# Patient Record
Sex: Male | Born: 1965 | Race: White | Hispanic: No | Marital: Married | Smoking: Never smoker
Health system: Southern US, Community
[De-identification: ages and names within clinical notes are randomized; demographics above are authoritative.]

## PROBLEM LIST (undated history)

## (undated) DIAGNOSIS — E7800 Pure hypercholesterolemia, unspecified: Secondary | ICD-10-CM

## (undated) DIAGNOSIS — I10 Essential (primary) hypertension: Secondary | ICD-10-CM

## (undated) HISTORY — PX: CHOLECYSTECTOMY: SHX55

## (undated) HISTORY — PX: OTHER SURGICAL HISTORY: SHX169

---

## 2000-04-08 ENCOUNTER — Encounter: Payer: Self-pay | Admitting: Emergency Medicine

## 2000-04-08 ENCOUNTER — Inpatient Hospital Stay (HOSPITAL_COMMUNITY): Admission: EM | Admit: 2000-04-08 | Discharge: 2000-04-11 | Payer: Self-pay | Admitting: Emergency Medicine

## 2000-04-09 ENCOUNTER — Encounter: Payer: Self-pay | Admitting: General Surgery

## 2011-09-29 DIAGNOSIS — E785 Hyperlipidemia, unspecified: Secondary | ICD-10-CM | POA: Insufficient documentation

## 2011-09-29 DIAGNOSIS — I10 Essential (primary) hypertension: Secondary | ICD-10-CM | POA: Insufficient documentation

## 2011-10-15 ENCOUNTER — Other Ambulatory Visit: Payer: Self-pay

## 2011-10-15 ENCOUNTER — Encounter (HOSPITAL_COMMUNITY)
Admission: EM | Disposition: A | Discharge status: Home / Self Care | Payer: Self-pay | Source: Home / Self Care | Attending: Orthopedic Surgery

## 2011-10-15 ENCOUNTER — Emergency Department (HOSPITAL_COMMUNITY): Payer: 59

## 2011-10-15 ENCOUNTER — Emergency Department (HOSPITAL_COMMUNITY): Payer: 59 | Admitting: Anesthesiology

## 2011-10-15 ENCOUNTER — Encounter: Payer: Self-pay | Admitting: *Deleted

## 2011-10-15 ENCOUNTER — Inpatient Hospital Stay (HOSPITAL_COMMUNITY)
Admission: EM | Admit: 2011-10-15 | Discharge: 2011-10-17 | DRG: 494 | Disposition: A | Discharge status: Home Health | Payer: 59 | Attending: Orthopedic Surgery | Admitting: Orthopedic Surgery

## 2011-10-15 ENCOUNTER — Encounter (HOSPITAL_COMMUNITY): Payer: Self-pay | Admitting: Anesthesiology

## 2011-10-15 DIAGNOSIS — S82853A Displaced trimalleolar fracture of unspecified lower leg, initial encounter for closed fracture: Principal | ICD-10-CM | POA: Diagnosis present

## 2011-10-15 DIAGNOSIS — I498 Other specified cardiac arrhythmias: Secondary | ICD-10-CM | POA: Diagnosis present

## 2011-10-15 DIAGNOSIS — R001 Bradycardia, unspecified: Secondary | ICD-10-CM

## 2011-10-15 DIAGNOSIS — S62319A Displaced fracture of base of unspecified metacarpal bone, initial encounter for closed fracture: Secondary | ICD-10-CM | POA: Diagnosis present

## 2011-10-15 DIAGNOSIS — S62309A Unspecified fracture of unspecified metacarpal bone, initial encounter for closed fracture: Secondary | ICD-10-CM

## 2011-10-15 DIAGNOSIS — I10 Essential (primary) hypertension: Secondary | ICD-10-CM | POA: Diagnosis present

## 2011-10-15 DIAGNOSIS — E7800 Pure hypercholesterolemia, unspecified: Secondary | ICD-10-CM | POA: Diagnosis present

## 2011-10-15 HISTORY — PX: ORIF ANKLE FRACTURE: SHX5408

## 2011-10-15 HISTORY — DX: Pure hypercholesterolemia, unspecified: E78.00

## 2011-10-15 HISTORY — DX: Essential (primary) hypertension: I10

## 2011-10-15 LAB — BASIC METABOLIC PANEL
GFR calc non Af Amer: 86 mL/min — ABNORMAL LOW (ref >90)
Glucose, Bld: 94 mg/dL (ref 70–99)
Potassium: 4.1 mEq/L (ref 3.5–5.1)
Sodium: 140 mEq/L (ref 135–145)

## 2011-10-15 LAB — CBC
Hemoglobin: 15.8 g/dL (ref 13.0–17.0)
MCHC: 35.5 g/dL (ref 30.0–36.0)
RBC: 4.92 MIL/uL (ref 4.22–5.81)
WBC: 16.3 10*3/uL — ABNORMAL HIGH (ref 4.0–10.5)

## 2011-10-15 LAB — PROTIME-INR: INR: 1.01 (ref 0.00–1.49)

## 2011-10-15 SURGERY — OPEN REDUCTION INTERNAL FIXATION (ORIF) ANKLE FRACTURE
Anesthesia: General | Site: Ankle | Laterality: Right | Wound class: Clean

## 2011-10-15 MED ORDER — COUMADIN BOOK
Freq: Once | Status: AC
  Administered 2011-10-16: 06:00:00
  Filled 2011-10-15: qty 1

## 2011-10-15 MED ORDER — WARFARIN VIDEO
Freq: Once | Status: DC
  Filled 2011-10-15: qty 1

## 2011-10-15 MED ORDER — HYDROMORPHONE HCL PF 1 MG/ML IJ SOLN: 0.2500 mg | INTRAMUSCULAR | Status: DC | PRN

## 2011-10-15 MED ORDER — FENTANYL CITRATE 0.05 MG/ML IJ SOLN
INTRAMUSCULAR | Status: DC | PRN
  Administered 2011-10-15: 100 ug via INTRAVENOUS
  Administered 2011-10-15: 150 ug via INTRAVENOUS

## 2011-10-15 MED ORDER — CEFAZOLIN SODIUM 1-5 GM-% IV SOLN
1.0000 g | Freq: Four times a day (QID) | INTRAVENOUS | Status: AC
  Administered 2011-10-15 – 2011-10-16 (×3): 1 g via INTRAVENOUS
  Filled 2011-10-15 (×4): qty 50

## 2011-10-15 MED ORDER — MIDAZOLAM HCL 5 MG/5ML IJ SOLN
INTRAMUSCULAR | Status: DC | PRN
  Administered 2011-10-15: 2 mg via INTRAVENOUS

## 2011-10-15 MED ORDER — METOCLOPRAMIDE HCL 5 MG/ML IJ SOLN
5.0000 mg | Freq: Three times a day (TID) | INTRAMUSCULAR | Status: DC | PRN
  Filled 2011-10-15: qty 2

## 2011-10-15 MED ORDER — CRANBERRY 250 MG PO TABS: 250.0000 mg | ORAL_TABLET | Freq: Every day | ORAL | Status: DC

## 2011-10-15 MED ORDER — CEFAZOLIN SODIUM-DEXTROSE 2-3 GM-% IV SOLR
2.0000 g | INTRAVENOUS | Status: DC
  Filled 2011-10-15: qty 50

## 2011-10-15 MED ORDER — METHOCARBAMOL 500 MG PO TABS
500.0000 mg | ORAL_TABLET | Freq: Four times a day (QID) | ORAL | Status: DC | PRN
  Administered 2011-10-16 – 2011-10-17 (×5): 500 mg via ORAL
  Filled 2011-10-15 (×6): qty 1

## 2011-10-15 MED ORDER — TETANUS-DIPHTH-ACELL PERTUSSIS 5-2.5-18.5 LF-MCG/0.5 IM SUSP
0.5000 mL | Freq: Once | INTRAMUSCULAR | Status: AC
  Administered 2011-10-15: 0.5 mL via INTRAMUSCULAR
  Filled 2011-10-15: qty 0.5

## 2011-10-15 MED ORDER — LIDOCAINE HCL 2 % IJ SOLN
20.0000 mL | Freq: Once | INTRAMUSCULAR | Status: AC
  Administered 2011-10-15: 400 mg
  Filled 2011-10-15: qty 1

## 2011-10-15 MED ORDER — HYDROMORPHONE HCL PF 1 MG/ML IJ SOLN
1.0000 mg | Freq: Once | INTRAMUSCULAR | Status: AC
  Administered 2011-10-15: 1 mg via INTRAVENOUS
  Filled 2011-10-15: qty 1

## 2011-10-15 MED ORDER — THERA M PLUS PO TABS
1.0000 | ORAL_TABLET | Freq: Every day | ORAL | Status: DC
  Administered 2011-10-16 – 2011-10-17 (×2): 1 via ORAL
  Filled 2011-10-15 (×2): qty 1

## 2011-10-15 MED ORDER — ATENOLOL 100 MG PO TABS
100.0000 mg | ORAL_TABLET | Freq: Every day | ORAL | Status: DC
  Administered 2011-10-16 – 2011-10-17 (×2): 100 mg via ORAL
  Filled 2011-10-15 (×2): qty 1

## 2011-10-15 MED ORDER — ACETAMINOPHEN 10 MG/ML IV SOLN
INTRAVENOUS | Status: DC | PRN
  Administered 2011-10-15: 1000 mg via INTRAVENOUS

## 2011-10-15 MED ORDER — SODIUM CHLORIDE 0.9 % IV BOLUS (SEPSIS)
1000.0000 mL | Freq: Once | INTRAVENOUS | Status: AC
  Administered 2011-10-15: 1000 mL via INTRAVENOUS

## 2011-10-15 MED ORDER — ACETAMINOPHEN 10 MG/ML IV SOLN
1000.0000 mg | Freq: Four times a day (QID) | INTRAVENOUS | Status: AC
  Administered 2011-10-16 (×4): 1000 mg via INTRAVENOUS
  Filled 2011-10-15 (×4): qty 100

## 2011-10-15 MED ORDER — HYDROMORPHONE HCL 2 MG PO TABS
2.0000 mg | ORAL_TABLET | ORAL | Status: DC | PRN
  Administered 2011-10-15 – 2011-10-17 (×11): 2 mg via ORAL
  Filled 2011-10-15 (×11): qty 1

## 2011-10-15 MED ORDER — PROPOFOL 10 MG/ML IV EMUL
INTRAVENOUS | Status: DC | PRN
  Administered 2011-10-15: 380 mg via INTRAVENOUS
  Administered 2011-10-15: 100 mg via INTRAVENOUS

## 2011-10-15 MED ORDER — ASPIRIN EC 81 MG PO TBEC
81.0000 mg | DELAYED_RELEASE_TABLET | Freq: Every day | ORAL | Status: DC
  Administered 2011-10-15 – 2011-10-17 (×3): 81 mg via ORAL
  Filled 2011-10-15 (×3): qty 1

## 2011-10-15 MED ORDER — ONDANSETRON HCL 4 MG/2ML IJ SOLN: 4.0000 mg | Freq: Four times a day (QID) | INTRAMUSCULAR | Status: DC | PRN

## 2011-10-15 MED ORDER — WARFARIN SODIUM 10 MG PO TABS
10.0000 mg | ORAL_TABLET | ORAL | Status: AC
  Administered 2011-10-16: 10 mg via ORAL
  Filled 2011-10-15: qty 1

## 2011-10-15 MED ORDER — MEPERIDINE HCL 25 MG/ML IJ SOLN: 6.2500 mg | INTRAMUSCULAR | Status: DC | PRN

## 2011-10-15 MED ORDER — ONDANSETRON HCL 4 MG PO TABS: 4.0000 mg | ORAL_TABLET | Freq: Four times a day (QID) | ORAL | Status: DC | PRN

## 2011-10-15 MED ORDER — HYDROMORPHONE HCL PF 1 MG/ML IJ SOLN
1.0000 mg | Freq: Once | INTRAMUSCULAR | Status: AC
  Administered 2011-10-15: 1 mg via INTRAMUSCULAR
  Filled 2011-10-15: qty 1

## 2011-10-15 MED ORDER — POTASSIUM CHLORIDE IN NACL 20-0.9 MEQ/L-% IV SOLN
INTRAVENOUS | Status: AC
  Administered 2011-10-15: via INTRAVENOUS
  Administered 2011-10-17: 1000 mL via INTRAVENOUS
  Filled 2011-10-15 (×3): qty 1000

## 2011-10-15 MED ORDER — GEMFIBROZIL 600 MG PO TABS
600.0000 mg | ORAL_TABLET | Freq: Two times a day (BID) | ORAL | Status: DC
  Administered 2011-10-16 – 2011-10-17 (×3): 600 mg via ORAL
  Filled 2011-10-15 (×6): qty 1

## 2011-10-15 MED ORDER — CEFAZOLIN SODIUM 1-5 GM-% IV SOLN
INTRAVENOUS | Status: DC | PRN
  Administered 2011-10-15: 2 g via INTRAVENOUS

## 2011-10-15 MED ORDER — GLYCOPYRROLATE 0.2 MG/ML IJ SOLN
INTRAMUSCULAR | Status: DC | PRN
Start: 2011-10-15 — End: 2011-10-15
  Administered 2011-10-15: .5 mg via INTRAVENOUS

## 2011-10-15 MED ORDER — SODIUM CHLORIDE 0.9 % IR SOLN
Status: DC | PRN
  Administered 2011-10-15: 1000 mL

## 2011-10-15 MED ORDER — METOCLOPRAMIDE HCL 10 MG PO TABS: 5.0000 mg | ORAL_TABLET | Freq: Three times a day (TID) | ORAL | Status: DC | PRN

## 2011-10-15 MED ORDER — METHOCARBAMOL 100 MG/ML IJ SOLN
500.0000 mg | Freq: Four times a day (QID) | INTRAVENOUS | Status: DC | PRN
  Filled 2011-10-15: qty 5

## 2011-10-15 MED ORDER — SENNOSIDES-DOCUSATE SODIUM 8.6-50 MG PO TABS: 1.0000 | ORAL_TABLET | Freq: Every evening | ORAL | Status: DC | PRN

## 2011-10-15 MED ORDER — ONDANSETRON HCL 4 MG/2ML IJ SOLN: 4.0000 mg | Freq: Once | INTRAMUSCULAR | Status: DC | PRN

## 2011-10-15 MED ORDER — NEOSTIGMINE METHYLSULFATE 1 MG/ML IJ SOLN
INTRAMUSCULAR | Status: DC | PRN
  Administered 2011-10-15: 3 mg via INTRAVENOUS

## 2011-10-15 MED ORDER — ROCURONIUM BROMIDE 100 MG/10ML IV SOLN
INTRAVENOUS | Status: DC | PRN
  Administered 2011-10-15: 50 mg via INTRAVENOUS

## 2011-10-15 MED ORDER — HYDROCHLOROTHIAZIDE 25 MG PO TABS
25.0000 mg | ORAL_TABLET | Freq: Every day | ORAL | Status: DC
  Administered 2011-10-16 – 2011-10-17 (×2): 25 mg via ORAL
  Filled 2011-10-15 (×2): qty 1

## 2011-10-15 MED ORDER — FENTANYL CITRATE 0.05 MG/ML IJ SOLN: INTRAMUSCULAR | Status: DC | PRN

## 2011-10-15 MED ORDER — LACTATED RINGERS IV SOLN
INTRAVENOUS | Status: DC | PRN
  Administered 2011-10-15 (×2): via INTRAVENOUS

## 2011-10-15 MED ORDER — CEFAZOLIN SODIUM 1-5 GM-% IV SOLN
INTRAVENOUS | Status: AC
  Filled 2011-10-15: qty 100

## 2011-10-15 SURGICAL SUPPLY — 68 items
BANDAGE ELASTIC 3 VELCRO ST LF (GAUZE/BANDAGES/DRESSINGS) ×2 IMPLANT
BANDAGE ELASTIC 4 VELCRO ST LF (GAUZE/BANDAGES/DRESSINGS) ×2 IMPLANT
BANDAGE ELASTIC 6 VELCRO ST LF (GAUZE/BANDAGES/DRESSINGS) ×2 IMPLANT
BANDAGE GAUZE ELAST BULKY 4 IN (GAUZE/BANDAGES/DRESSINGS) ×2 IMPLANT
BLADE SURG 10 STRL SS (BLADE) IMPLANT
BNDG COHESIVE 6X5 TAN STRL LF (GAUZE/BANDAGES/DRESSINGS) ×2 IMPLANT
BNDG ESMARK 4X9 LF (GAUZE/BANDAGES/DRESSINGS) ×2 IMPLANT
CLOTH BEACON ORANGE TIMEOUT ST (SAFETY) ×2 IMPLANT
COVER MAYO STAND STRL (DRAPES) ×2 IMPLANT
COVER SURGICAL LIGHT HANDLE (MISCELLANEOUS) ×2 IMPLANT
CUFF TOURNIQUET SINGLE 34IN LL (TOURNIQUET CUFF) IMPLANT
CUFF TOURNIQUET SINGLE 44IN (TOURNIQUET CUFF) IMPLANT
DRAPE C-ARM 42X72 X-RAY (DRAPES) ×2 IMPLANT
DRAPE INCISE IOBAN 66X45 STRL (DRAPES) IMPLANT
DRAPE SURG 17X23 STRL (DRAPES) ×2 IMPLANT
DRAPE U-SHAPE 47X51 STRL (DRAPES) ×2 IMPLANT
DRSG PAD ABDOMINAL 8X10 ST (GAUZE/BANDAGES/DRESSINGS) ×2 IMPLANT
DURAPREP 26ML APPLICATOR (WOUND CARE) IMPLANT
ELECT REM PT RETURN 9FT ADLT (ELECTROSURGICAL) ×2
ELECTRODE REM PT RTRN 9FT ADLT (ELECTROSURGICAL) ×1 IMPLANT
FACESHIELD LNG OPTICON STERILE (SAFETY) ×2 IMPLANT
GAUZE XEROFORM 5X9 LF (GAUZE/BANDAGES/DRESSINGS) ×2 IMPLANT
GLOVE BIOGEL PI IND STRL 8 (GLOVE) ×1 IMPLANT
GLOVE BIOGEL PI INDICATOR 8 (GLOVE) ×1
GLOVE SURG ORTHO 8.0 STRL STRW (GLOVE) ×2 IMPLANT
GOWN PREVENTION PLUS LG XLONG (DISPOSABLE) IMPLANT
GOWN STRL NON-REIN LRG LVL3 (GOWN DISPOSABLE) ×6 IMPLANT
HANDPIECE INTERPULSE COAX TIP (DISPOSABLE)
KIT BASIN OR (CUSTOM PROCEDURE TRAY) ×2 IMPLANT
KIT ROOM TURNOVER OR (KITS) ×2 IMPLANT
MANIFOLD NEPTUNE II (INSTRUMENTS) ×2 IMPLANT
NEEDLE HYPO 25GX1X1/2 BEV (NEEDLE) ×2 IMPLANT
NS IRRIG 1000ML POUR BTL (IV SOLUTION) ×2 IMPLANT
PACK ORTHO EXTREMITY (CUSTOM PROCEDURE TRAY) ×2 IMPLANT
PAD ARMBOARD 7.5X6 YLW CONV (MISCELLANEOUS) ×2 IMPLANT
PAD CAST 4YDX4 CTTN HI CHSV (CAST SUPPLIES) ×2 IMPLANT
PADDING CAST COTTON 4X4 STRL (CAST SUPPLIES) ×2
PLATE FIBULA DIST 4H (Plate) ×2 IMPLANT
SCREW 5.0X18 (Screw) ×2 IMPLANT
SCREW LOCK 12X3.5XST PRLC (Screw) ×2 IMPLANT
SCREW LOCK 3.5X12 (Screw) ×2 IMPLANT
SCREW LOCK 3.5X14 (Screw) ×2 IMPLANT
SCREW LOCK 3.5X16 (Screw) ×4 IMPLANT
SCREW LOCK 3.5X26 (Screw) ×2 IMPLANT
SCREW NL 3.5X28 (Screw) ×2 IMPLANT
SCREW NL 3.5X38MM (Screw) ×2 IMPLANT
SCREW NL 3.5X50 (Screw) ×2 IMPLANT
SCREW NLCK 16X3.5XST CORT PRLC (Screw) ×2 IMPLANT
SCREW NON LOCK 3.5X20 (Screw) ×2 IMPLANT
SCREW NON LOCKING 3.5X48 (Screw) ×2 IMPLANT
SCREW NONLOCK 3.5X16 (Screw) ×2 IMPLANT
SCREW NONLOCK 3.5X18 (Screw) ×2 IMPLANT
SET HNDPC FAN SPRY TIP SCT (DISPOSABLE) IMPLANT
SPLINT FIBERGLASS 4X15 (CAST SUPPLIES) ×2 IMPLANT
SPONGE GAUZE 4X4 12PLY (GAUZE/BANDAGES/DRESSINGS) ×2 IMPLANT
SPONGE LAP 18X18 X RAY DECT (DISPOSABLE) ×2 IMPLANT
STOCKINETTE IMPERVIOUS 9X36 MD (GAUZE/BANDAGES/DRESSINGS) IMPLANT
SUCTION FRAZIER TIP 10 FR DISP (SUCTIONS) ×2 IMPLANT
SUT ETHILON 3 0 PS 1 (SUTURE) ×6 IMPLANT
SUT VIC AB 2-0 CTB1 (SUTURE) ×6 IMPLANT
SUT VIC AB 3-0 SH 27 (SUTURE) ×2
SUT VIC AB 3-0 SH 27X BRD (SUTURE) ×2 IMPLANT
SYR CONTROL 10ML LL (SYRINGE) ×2 IMPLANT
TOWEL OR 17X24 6PK STRL BLUE (TOWEL DISPOSABLE) ×2 IMPLANT
TOWEL OR 17X26 10 PK STRL BLUE (TOWEL DISPOSABLE) ×2 IMPLANT
TUBE CONNECTING 12X1/4 (SUCTIONS) ×2 IMPLANT
WATER STERILE IRR 1000ML POUR (IV SOLUTION) ×2 IMPLANT
YANKAUER SUCT BULB TIP NO VENT (SUCTIONS) IMPLANT
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
## 2011-10-15 NOTE — Op Note (Signed)
NAMEDARROL, BRANDENBURG NO.:  [id number]  MEDICAL RECORD NO.:  [id number]  LOCATION:  [facility]                         FACILITY:  [facility]  PHYSICIAN:  Burnard Bunting, M.D.    DATE OF BIRTH:  08/11/1966  DATE OF PROCEDURE:  10/15/2011 DATE OF DISCHARGE:                              OPERATIVE REPORT   PREOPERATIVE DIAGNOSIS:  Right trimalleolar ankle fracture.  POSTOPERATIVE DIAGNOSIS:  Right trimalleolar ankle fracture.  PROCEDURE:  Right trimalleolar ankle fracture, open reduction and internal fixation of lateral, posterior, and medial malleolus.  SURGEON:  Burnard Bunting, MD  ASSISTANT:  None.  ANESTHESIA:  General endotracheal.  ESTIMATED BLOOD LOSS:  50 mL.  DRAINS:  None.  Ankle Esmarch utilized for approximately 15 minutes.  INDICATIONS:  Tucker is a patient with right trimalleolar ankle fracture presents now for operative management after explanation of risks and benefits.  PROCEDURE IN DETAIL:  The patient was brought to the operating room where general endotracheal anesthesia was induced.  Preoperative antibiotics were administered.  Right leg was prescribed with alcohol and Betadine, which allowed to air dry and prepped with DuraPrep solution, draped in sterile manner.  Time-out was called.  Right leg was then prepped in DuraPrep solution and draped in a sterile manner.  Collier Flowers was used to cover the operative field.  Leg was elevated and exsanguinated with the ankle Esmarch, which was used around the distal ankle, so incision was made over the lateral malleolus.  Skin and subcutaneous tissues were sharply divided.  Care was taken to avoid injury to superficial peroneal nerve.  Fracture was reduced and held in place, and a lag screw was placed.  The plate was then placed for stabilization with good fixation and achieved in the bone.  Placement was confirmed in the AP and lateral planes under fluoroscopy.  This incision was thoroughly irrigated and  packed with a moist sponge. Incision was then directed towards the anteromedial tibia.  The skin and subcutaneous tissue were sharply divided.  Care was taken to avoid injury to the saphenous vein and nerve.  Medial malleolus fracture and the posterior malleolus fracture was reduced.  It was held with 2 screws, placed in necessary fashion to achieve fixation.  These were cortical screws placed in a lag fashion.  Good reduction was confirmed on the AP and lateral planes under fluoroscopy.  Syndesmosis was stable to stress.  Both incisions were then thoroughly irrigated.  Tourniquet was released.  Bleeding points were encountered and controlled using electrocautery.  Incisions were closed using a 3-0 Vicryl, 2-0 Vicryl, and 3-0 nylon.  The patient tolerated the procedure well without immediate complications.  Well-padded posterior splint was applied.     Burnard Bunting, M.D.     GSD/MEDQ  D:  10/15/2011  T:  10/15/2011  Job:  [id number]
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
## 2011-10-15 NOTE — ED Notes (Signed)
Ortho called to splint lt hand.

## 2011-10-15 NOTE — Anesthesia Postprocedure Evaluation (Signed)
  Anesthesia Post-op Note  Patient: Micheal Chandler  Procedure(s) Performed:  OPEN REDUCTION INTERNAL FIXATION (ORIF) ANKLE FRACTURE  Patient Location: PACU  Anesthesia Type: GA combined with regional for post-op pain  Level of Consciousness: awake, alert  and oriented  Airway and Oxygen Therapy: Patient Spontanous Breathing and Patient connected to nasal cannula oxygen  Post-op Pain: mild  Post-op Assessment: Post-op Vital signs reviewed, Patient's Cardiovascular Status Stable, Respiratory Function Stable, Patent Airway, No signs of Nausea or vomiting and Pain level controlled  Post-op Vital Signs: Reviewed and stable  Complications: No apparent anesthesia complications

## 2011-10-15 NOTE — Anesthesia Preprocedure Evaluation (Addendum)
Anesthesia Evaluation  Patient identified by MRN, date of birth, ID band Patient awake    Reviewed: Allergy & Precautions, H&P , NPO status , Patient's Chart, lab work & pertinent test results, reviewed documented beta blocker date and time   Airway Mallampati: I TM Distance: >3 FB Neck ROM: Full    Dental  (+) Teeth Intact and Dental Advisory Given   Pulmonary    Pulmonary exam normal       Cardiovascular hypertension, Pt. on home beta blockers     Neuro/Psych    GI/Hepatic   Endo/Other  Morbid obesity  Renal/GU      Musculoskeletal   Abdominal   Peds  Hematology   Anesthesia Other Findings   Reproductive/Obstetrics                          Anesthesia Physical Anesthesia Plan  ASA: II and Emergent  Anesthesia Plan: General and Regional   Post-op Pain Management:    Induction: Intravenous  Airway Management Planned: Oral ETT  Additional Equipment:   Intra-op Plan:   Post-operative Plan: Extubation in OR  Informed Consent: I have reviewed the patients History and Physical, chart, labs and discussed the procedure including the risks, benefits and alternatives for the proposed anesthesia with the patient or authorized representative who has indicated his/her understanding and acceptance.   Dental advisory given  Plan Discussed with: CRNA and Surgeon  Anesthesia Plan Comments:        Anesthesia Quick Evaluation

## 2011-10-15 NOTE — ED Notes (Signed)
Ortho at bed side to set lt wrist

## 2011-10-15 NOTE — Transfer of Care (Signed)
Immediate Anesthesia Transfer of Care Note  Patient: Micheal Chandler  Procedure(s) Performed:  OPEN REDUCTION INTERNAL FIXATION (ORIF) ANKLE FRACTURE  Patient Location: PACU  Anesthesia Type: General  Level of Consciousness: sedated  Airway & Oxygen Therapy: Patient Spontanous Breathing and Patient connected to nasal cannula oxygen  Post-op Assessment: Report given to PACU RN and Post -op Vital signs reviewed and stable  Post vital signs: Reviewed and stable  Complications: No apparent anesthesia complications

## 2011-10-15 NOTE — Progress Notes (Signed)
Orthopedic Tech Progress Note Patient Details:  Micheal Chandler 01/05/1966 [id number]  Type of Splint: Short Arm Splint Location: ulnar gutter splint Splint Interventions: Application    Nikki Dom 10/15/2011, 5:14 PM Dr Denton Lank gave verbal order for ulnar gutter splint

## 2011-10-15 NOTE — ED Notes (Signed)
Ancef sent to OR

## 2011-10-15 NOTE — ED Notes (Signed)
Dr. Denton Lank at bedside to numb area of fracture with lidocaine and assist with splinting of same.
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
## 2011-10-15 NOTE — ED Notes (Signed)
Patient is resting comfortably. Pain is decreased. Awaiting xray

## 2011-10-15 NOTE — Brief Op Note (Signed)
10/15/2011  9:04 PM  PATIENT:  Micheal Chandler  45 y.o. male  PRE-OPERATIVE DIAGNOSIS:  right ankle fracture  POST-OPERATIVE DIAGNOSIS:  Right Ankle Fracture  PROCEDURE:  Procedure(s): OPEN REDUCTION INTERNAL FIXATION (ORIF) ANKLE FRACTURE  SURGEON:  Surgeon(s): Corrie Mckusick Damondre Pfeifle  ASSISTANT:   ANESTHESIA:   regional and general  EBL: 50 ml    Total I/O In: 1100 [I.V.:1100] Out: -   BLOOD ADMINISTERED: none  DRAINS: none   LOCAL MEDICATIONS USED:  none  SPECIMEN:  No Specimen  COUNTS:  YES  TOURNIQUET:  * Missing tourniquet times found for documented tourniquets in log:  8782 * ankle esmarch 50 min  DICTATION: .Other Dictation: Dictation Number [id number]  PLAN OF CARE: Admit to inpatient   PATIENT DISPOSITION:  PACU - hemodynamically stable

## 2011-10-15 NOTE — ED Notes (Signed)
Lidocaine 2% pulled from pyxis and given to Dr. Denton Lank at this time.

## 2011-10-15 NOTE — ED Notes (Signed)
Ortho tech notified of need for right leg splint
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
## 2011-10-16 LAB — PROTIME-INR: INR: 1.18 (ref 0.00–1.49)

## 2011-10-16 MED ORDER — LACTATED RINGERS IV SOLN: INTRAVENOUS | Status: AC

## 2011-10-16 MED ORDER — WARFARIN SODIUM 10 MG PO TABS
10.0000 mg | ORAL_TABLET | Freq: Once | ORAL | Status: AC
  Administered 2011-10-16: 10 mg via ORAL
  Filled 2011-10-16: qty 1
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
## 2011-10-17 LAB — PROTIME-INR
INR: 1.36 (ref 0.00–1.49)
Prothrombin Time: 17 s — ABNORMAL HIGH (ref 11.6–15.2)

## 2011-10-17 MED ORDER — WARFARIN SODIUM 10 MG PO TABS
10.0000 mg | ORAL_TABLET | Freq: Once | ORAL | Status: DC
  Filled 2011-10-17: qty 1

## 2011-10-17 MED ORDER — WARFARIN SODIUM 10 MG PO TABS: 5.0000 mg | ORAL_TABLET | Freq: Once | ORAL | Status: DC

## 2011-10-17 MED ORDER — HYDROMORPHONE HCL 2 MG PO TABS: 2.0000 mg | ORAL_TABLET | ORAL | Status: AC | PRN

## 2011-10-17 MED ORDER — METHOCARBAMOL 500 MG PO TABS: 500.0000 mg | ORAL_TABLET | Freq: Four times a day (QID) | ORAL | Status: AC | PRN

## 2011-10-17 NOTE — Progress Notes (Signed)
Spoke with patient and wife. Choice offered. Home Health RN for coumadin management and PT arranged thru [facility]. Crutches ordered.
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
## 2011-10-17 NOTE — Progress Notes (Addendum)
Physical Therapy Treatment Patient Details Name: Micheal Chandler MRN: [id number] DOB: [birth year] Today's Date: 10/17/2011  PT Assessment/Plan  PT - Assessment/Plan Comments on Treatment Session: progressing well; Crutches seem to be the optimal assistive device as pt does not like the platform RW; pt very much wanting to dc home; will benefit form further crutch training, but otherwise on track for dc home PT Plan: Discharge plan remains appropriate PT Frequency: Min 6X/week Follow Up Recommendations: Home health PT Equipment Recommended: 3 in 1 bedside comode;Other (comment) (crutches, possibly wheelchair for work access) PT Goals  Acute Rehab PT Goals PT Goal: Supine/Side to Sit - Progress: Progressing toward goal PT Goal: Sit to Supine/Side - Progress: Progressing toward goal PT Transfer Goal: Sit to Stand/Stand to Sit - Progress: Progressing toward goal PT Transfer Goal: Bed to Chair/Chair to Bed - Progress: Other (comment) PT Goal: Ambulate - Progress: Progressing toward goal PT Goal: Up/Down Stairs - Progress: Progressing toward goal PT Goal: Propel Wheelchair - Progress: Other (comment)  PT Treatment Precautions/Restrictions  Precautions Precautions: Fall Precaution Comments: recommend staff member assist pt when using crutches Required Braces or Orthoses: No Restrictions Weight Bearing Restrictions: Yes LUE Weight Bearing: Weight bearing as tolerated (order states may WB through L hand) RLE Weight Bearing: Non weight bearing Mobility (including Balance) Bed Mobility Supine to Sit: 5: Supervision Supine to Sit Details (indicate cue type and reason): cues for optimal positioning Sitting - Scoot to Edge of Bed: 5: Supervision Transfers Sit to Stand: 4: Min assist;From bed (slight LOB upon initial stand) Sit to Stand Details (indicate cue type and reason): cues for self monitor for balance Stand to Sit: 4: Min assist Stand to Sit Details: cues for optimal positioning and  to control descent Ambulation/Gait Ambulation/Gait Assistance: 3: Mod assist Ambulation/Gait Assistance Details (indicate cue type and reason): up to mod A with crutches secondary to occasional LOB Ambulation Distance (Feet): 100 Feet Assistive device: Crutches;Other (Comment) (cued to WB through Lthumb or L axilla occasionally prn) Stairs: Yes Stairs Assistance: 3: Mod assist Stairs Assistance Details (indicate cue type and reason): verbal, tactile, demo cues for sequence, technique; practiced with 2 crutches and crutch and rail Stair Management Technique: With crutches Number of Stairs: 2  Wheelchair Mobility Wheelchair Mobility: No  Posture/Postural Control Posture/Postural Control: No significant limitations Balance Balance Assessed: No Exercise    End of Session PT - End of Session Equipment Utilized During Treatment: Gait belt Activity Tolerance: Patient tolerated treatment well Patient left: in chair;with call bell in reach Nurse Communication: Mobility status for ambulation;Mobility status for transfers General Behavior During Session: [facility] for tasks performed Cognition: [facility] for tasks performed  [place], [place] [phone]  10/17/2011, 11:03 AM
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
## 2011-10-21 ENCOUNTER — Other Ambulatory Visit: Payer: Self-pay | Admitting: Orthopedic Surgery

## 2011-10-21 ENCOUNTER — Ambulatory Visit
Admission: RE | Admit: 2011-10-21 | Discharge: 2011-10-21 | Disposition: A | Discharge status: Home / Self Care | Payer: 59 | Source: Ambulatory Visit | Attending: Orthopedic Surgery | Admitting: Orthopedic Surgery

## 2011-10-21 DIAGNOSIS — R52 Pain, unspecified: Secondary | ICD-10-CM

## 2011-10-21 DIAGNOSIS — R609 Edema, unspecified: Secondary | ICD-10-CM

## 2011-10-24 ENCOUNTER — Other Ambulatory Visit: Payer: Self-pay

## 2011-10-24 ENCOUNTER — Emergency Department (INDEPENDENT_AMBULATORY_CARE_PROVIDER_SITE_OTHER): Payer: 59

## 2011-10-24 ENCOUNTER — Emergency Department (HOSPITAL_BASED_OUTPATIENT_CLINIC_OR_DEPARTMENT_OTHER): Payer: 59

## 2011-10-24 ENCOUNTER — Encounter (HOSPITAL_BASED_OUTPATIENT_CLINIC_OR_DEPARTMENT_OTHER): Payer: Self-pay | Admitting: Emergency Medicine

## 2011-10-24 ENCOUNTER — Emergency Department (HOSPITAL_BASED_OUTPATIENT_CLINIC_OR_DEPARTMENT_OTHER)
Admission: EM | Admit: 2011-10-24 | Discharge: 2011-10-24 | Disposition: A | Discharge status: Home / Self Care | Payer: 59 | Attending: Emergency Medicine | Admitting: Emergency Medicine

## 2011-10-24 DIAGNOSIS — I10 Essential (primary) hypertension: Secondary | ICD-10-CM | POA: Insufficient documentation

## 2011-10-24 DIAGNOSIS — Z79899 Other long term (current) drug therapy: Secondary | ICD-10-CM | POA: Insufficient documentation

## 2011-10-24 DIAGNOSIS — R0989 Other specified symptoms and signs involving the circulatory and respiratory systems: Secondary | ICD-10-CM | POA: Insufficient documentation

## 2011-10-24 DIAGNOSIS — E0789 Other specified disorders of thyroid: Secondary | ICD-10-CM

## 2011-10-24 DIAGNOSIS — R0609 Other forms of dyspnea: Secondary | ICD-10-CM | POA: Insufficient documentation

## 2011-10-24 DIAGNOSIS — R0602 Shortness of breath: Secondary | ICD-10-CM | POA: Insufficient documentation

## 2011-10-24 DIAGNOSIS — R0600 Dyspnea, unspecified: Secondary | ICD-10-CM

## 2011-10-24 DIAGNOSIS — R791 Abnormal coagulation profile: Secondary | ICD-10-CM

## 2011-10-24 DIAGNOSIS — E7800 Pure hypercholesterolemia, unspecified: Secondary | ICD-10-CM | POA: Insufficient documentation

## 2011-10-24 LAB — BASIC METABOLIC PANEL
CO2: 28 mEq/L (ref 19–32)
Chloride: 102 mEq/L (ref 96–112)
Creatinine, Ser: 0.9 mg/dL (ref 0.50–1.35)
Glucose, Bld: 102 mg/dL — ABNORMAL HIGH (ref 70–99)

## 2011-10-24 LAB — DIFFERENTIAL
Basophils Absolute: 0.1 10*3/uL (ref 0.0–0.1)
Lymphocytes Relative: 27 % (ref 12–46)
Lymphs Abs: 2.7 10*3/uL (ref 0.7–4.0)
Monocytes Absolute: 1.1 10*3/uL — ABNORMAL HIGH (ref 0.1–1.0)
Neutro Abs: 6.1 10*3/uL (ref 1.7–7.7)

## 2011-10-24 LAB — CBC
HCT: 40.3 % (ref 39.0–52.0)
Hemoglobin: 14.2 g/dL (ref 13.0–17.0)
MCV: 86.7 fL (ref 78.0–100.0)
RBC: 4.65 MIL/uL (ref 4.22–5.81)
RDW: 12.6 % (ref 11.5–15.5)
WBC: 10.2 10*3/uL (ref 4.0–10.5)

## 2011-10-24 LAB — CARDIAC PANEL(CRET KIN+CKTOT+MB+TROPI)
CK, MB: 1.2 ng/mL (ref 0.3–4.0)
Total CK: 44 U/L (ref 7–232)
Troponin I: <0.3 ng/mL (ref <0.30)

## 2011-10-24 LAB — PROTIME-INR: Prothrombin Time: 17.4 seconds — ABNORMAL HIGH (ref 11.6–15.2)

## 2011-10-24 LAB — APTT: aPTT: 40 seconds — ABNORMAL HIGH (ref 24–37)

## 2011-10-24 MED ORDER — ENOXAPARIN SODIUM 100 MG/ML ~~LOC~~ SOLN: 1.0000 mg/kg | Freq: Two times a day (BID) | SUBCUTANEOUS | Status: DC

## 2011-10-24 MED ORDER — ENOXAPARIN SODIUM 150 MG/ML ~~LOC~~ SOLN
1.0000 mg/kg | Freq: Once | SUBCUTANEOUS | Status: AC
  Administered 2011-10-24: 135 mg via SUBCUTANEOUS
  Filled 2011-10-24: qty 1

## 2011-10-24 MED ORDER — IOHEXOL 350 MG/ML SOLN
80.0000 mL | Freq: Once | INTRAVENOUS | Status: AC | PRN
  Administered 2011-10-24: 80 mL via INTRAVENOUS

## 2011-10-24 MED ORDER — SODIUM CHLORIDE 0.9 % IV SOLN
INTRAVENOUS | Status: DC
  Administered 2011-10-24: 21:00:00 via INTRAVENOUS
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
## 2011-10-24 NOTE — Patient Instructions (Signed)
Pt instructed on the proper use of an incentive spirometry. Pt goals of . Pt accomplished goal of without any discomfort. Pt tolerated well.

## 2011-10-24 NOTE — ED Notes (Signed)
Patient is resting comfortably. 
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
## 2011-10-25 ENCOUNTER — Encounter (HOSPITAL_COMMUNITY): Payer: Self-pay | Admitting: Orthopedic Surgery

## 2011-10-28 NOTE — Progress Notes (Signed)
Utilization review completed. Anette Guarneri, RN, BSN. 10/28/11
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
## 2014-06-10 DIAGNOSIS — E669 Obesity, unspecified: Secondary | ICD-10-CM | POA: Insufficient documentation

## 2016-12-09 DIAGNOSIS — R3129 Other microscopic hematuria: Secondary | ICD-10-CM | POA: Insufficient documentation

## 2016-12-09 DIAGNOSIS — N419 Inflammatory disease of prostate, unspecified: Secondary | ICD-10-CM | POA: Insufficient documentation

## 2016-12-09 DIAGNOSIS — R300 Dysuria: Secondary | ICD-10-CM | POA: Insufficient documentation

## 2019-08-13 ENCOUNTER — Other Ambulatory Visit: Payer: Self-pay | Admitting: Podiatry

## 2019-08-13 ENCOUNTER — Encounter: Payer: Self-pay | Admitting: Podiatry

## 2019-08-13 ENCOUNTER — Other Ambulatory Visit: Payer: Self-pay

## 2019-08-13 ENCOUNTER — Ambulatory Visit: Payer: BC Managed Care – PPO | Admitting: Podiatry

## 2019-08-13 ENCOUNTER — Ambulatory Visit (INDEPENDENT_AMBULATORY_CARE_PROVIDER_SITE_OTHER): Payer: BC Managed Care – PPO

## 2019-08-13 VITALS — BP 138/79 | HR 55 | Resp 16

## 2019-08-13 DIAGNOSIS — M79672 Pain in left foot: Secondary | ICD-10-CM

## 2019-08-13 DIAGNOSIS — M109 Gout, unspecified: Secondary | ICD-10-CM | POA: Insufficient documentation

## 2019-08-13 DIAGNOSIS — M79671 Pain in right foot: Secondary | ICD-10-CM | POA: Diagnosis not present

## 2019-08-13 DIAGNOSIS — G629 Polyneuropathy, unspecified: Secondary | ICD-10-CM

## 2019-08-13 NOTE — Progress Notes (Signed)
   Subjective:    Patient ID: Micheal Chandler, male    DOB: Apr 21, 1966, 53 y.o.   MRN: [id number]  HPI    Review of Systems  All other systems reviewed and are negative.      Objective:   Physical Exam        Assessment & Plan:

## 2019-08-15 NOTE — Progress Notes (Signed)
Subjective:   Patient ID: Micheal Chandler, male   DOB: 53 y.o.   MRN: [id number]   HPI Patient presents that he is concerned about getting some numbness in his toes and that he is quite active and runs at least 20 miles a week.  Patient states is not debilitating he has had no issues with balance but just wanted to get it checked as he also is very heavy currently   Review of Systems  All other systems reviewed and are negative.       Objective:  Physical Exam Vitals signs and nursing note reviewed.  Constitutional:      Appearance: He is well-developed.  Pulmonary:     Effort: Pulmonary effort is normal.  Musculoskeletal: Normal range of motion.  Skin:    General: Skin is warm.  Neurological:     Mental Status: He is alert.     Neurovascular status found to be intact muscle strength adequate range of motion within normal limits with patient noted to have mild numbness in the digits bilateral.  I did full sharp dull vibratory and DTR reflexes and did not note any pathology and questioned him extensively on any other forms of neurological pathology     Assessment:  Possibility for low-grade neuropathy with obesity is complicating factor and history of gout     Plan:  H&P reviewed x-rays and at this point recommended supportive shoes and anti-inflammatories and will see patient back if symptoms were to persist.  I did discuss neurological consult if he should start to develop any balance issues or progression of the low-grade numbness that he is experiencing in his feet  X-rays were negative for signs of arthritis or any other kinds of bone pathology at the current time

## 2019-12-03 ENCOUNTER — Other Ambulatory Visit: Payer: Self-pay

## 2019-12-03 ENCOUNTER — Ambulatory Visit: Payer: BC Managed Care – PPO | Admitting: Neurology

## 2019-12-03 ENCOUNTER — Encounter: Payer: Self-pay | Admitting: Neurology

## 2019-12-03 VITALS — BP 135/78 | HR 51 | Temp 97.8°F | Ht 70.0 in | Wt 288.0 lb

## 2019-12-03 DIAGNOSIS — R2689 Other abnormalities of gait and mobility: Secondary | ICD-10-CM | POA: Diagnosis not present

## 2019-12-03 DIAGNOSIS — R200 Anesthesia of skin: Secondary | ICD-10-CM

## 2019-12-03 NOTE — Progress Notes (Signed)
[facility]  PATIENT: Micheal Chandler DOB: 1966/02/12  REFERRING DOCTOR OR PCP:  Donald Prose. MD SOURCE: patient, notes and labs from Dr. Nancy Fetter  _________________________________   HISTORICAL  CHIEF COMPLAINT:  Chief Complaint  Patient presents with  . New Patient (Initial Visit)    RM 12, alone. Paper referral for numbness in toes, balance issues.     HISTORY OF PRESENT ILLNESS:  I had the pleasure of seeing your patient, Micheal Chandler, at [facility] for neurologic consultation regarding his foot numbness and difficulties with balance.  He is a 53 year old man who has noted numbness in his feet and difficulties in balance since August while training for a 50K run.   The runs are on trails.   Initially, he had numbness up the toes but now is just in the toe tips now.    He has high quality running shoes.   He is still walking 5K a day but is not running.    He has not noted any weakness.    He denies any bladder problems.  He also has notes that balance is slightly off.  He has had similar issues off/on in the past with sinus issues.    He denies vertigo.   He can go up/down stairs without using the bannister.  However, if he walks a balance beam or straight line he feels he is off.      He is generally in good shape and has done marathons and long trail runs x many years.   However, he does have hypertension and hyperlipidemia.  He does not have hyperglycemia/DM.   He has no neck pain or stiffness.      No changes in bladder function over the past year.      I reviewed labs from 07/17/2019.   Glucose was 88.  Other labs also normal.  REVIEW OF SYSTEMS: Constitutional: No fevers, chills, sweats, or change in appetite Eyes: No visual changes, double vision, eye pain Ear, nose and throat: No hearing loss, ear pain, nasal congestion, sore throat Cardiovascular: No chest pain, palpitations Respiratory: No shortness of breath at rest or with  exertion.   No wheezes GastrointestinaI: No nausea, vomiting, diarrhea, abdominal pain, fecal incontinence Genitourinary: No dysuria, urinary retention or frequency.  No nocturia. Musculoskeletal: No neck pain, back pain Integumentary: No rash, pruritus, skin lesions Neurological: as above Psychiatric: No depression at this time.  No anxiety Endocrine: No palpitations, diaphoresis, change in appetite, change in weigh or increased thirst Hematologic/Lymphatic: No anemia, purpura, petechiae. Allergic/Immunologic: No itchy/runny eyes, nasal congestion, recent allergic reactions, rashes  ALLERGIES: Allergies  Allergen Reactions  . Warfarin Sodium Other (See Comments)    Patient states he had difficulty breathing; pt stated, "It does not make me feel good"  . Hydrocodone     Patient states that it does not work for him    HOME MEDICATIONS:  Current Outpatient Medications:  .  aspirin EC 81 MG tablet, Take 81 mg by mouth daily.  , Disp: , Rfl:  .  atenolol (TENORMIN) 100 MG tablet, Take 100 mg by mouth daily.  , Disp: , Rfl:  .  Cholecalciferol (VITAMIN D3 PO), Take 5,000 Units by mouth daily., Disp: , Rfl:  .  CRANBERRY PO, Take 1 capsule by mouth daily.  , Disp: , Rfl:  .  gemfibrozil (LOPID) 600 MG tablet, Take 600 mg by mouth daily. , Disp: , Rfl:  .  hydrochlorothiazide (HYDRODIURIL) 25 MG  tablet, Take 25 mg by mouth daily.  , Disp: , Rfl:  .  Misc Natural Products (TART CHERRY ADVANCED PO), Take 1 tablet by mouth daily., Disp: , Rfl:  .  Multiple Vitamin (MULTIVITAMIN PO), Take 1 tablet by mouth daily., Disp: , Rfl:   PAST MEDICAL HISTORY: Past Medical History:  Diagnosis Date  . Hypercholesteremia   . Hypertension     PAST SURGICAL HISTORY: Past Surgical History:  Procedure Laterality Date  . CHOLECYSTECTOMY    . ORIF ANKLE FRACTURE  10/15/2011   Procedure: OPEN REDUCTION INTERNAL FIXATION (ORIF) ANKLE FRACTURE;  Surgeon: Meredith Pel;  Location: [facility];  Service:  Orthopedics;  Laterality: Right;    FAMILY HISTORY: No family history on file.  SOCIAL HISTORY:  Social History   Socioeconomic History  . Marital status: Married    Spouse name: Judson Roch  . Number of children: 2  . Years of education: Not on file  . Highest education level: Not on file  Occupational History  . Not on file  Tobacco Use  . Smoking status: Never Smoker  . Smokeless tobacco: Former Systems developer    Types: Chew  Substance and Sexual Activity  . Alcohol use: Yes    Comment: occ  . Drug use: No  . Sexual activity: Not on file  Other Topics Concern  . Not on file  Social History Narrative   Lives with wife   Caffeine use: 2-3 cups per day   Right handed   Social Determinants of Health   Financial Resource Strain:   . Difficulty of Paying Living Expenses: Not on file  Food Insecurity:   . Worried About Charity fundraiser in the Last Year: Not on file  . Ran Out of Food in the Last Year: Not on file  Transportation Needs:   . Lack of Transportation (Medical): Not on file  . Lack of Transportation (Non-Medical): Not on file  Physical Activity:   . Days of Exercise per Week: Not on file  . Minutes of Exercise per Session: Not on file  Stress:   . Feeling of Stress : Not on file  Social Connections:   . Frequency of Communication with Friends and Family: Not on file  . Frequency of Social Gatherings with Friends and Family: Not on file  . Attends Religious Services: Not on file  . Active Member of Clubs or Organizations: Not on file  . Attends Archivist Meetings: Not on file  . Marital Status: Not on file  Intimate Partner Violence:   . Fear of Current or Ex-Partner: Not on file  . Emotionally Abused: Not on file  . Physically Abused: Not on file  . Sexually Abused: Not on file     PHYSICAL EXAM  Vitals:   12/03/19 1119  BP: 135/78  Pulse: (!) 51  Temp: 97.8 F (36.6 C)  Weight: 288 lb (130.6 kg)  Height: '5\' 10"'$  (1.778 m)    Body mass  index is 41.32 kg/m.   General: The patient is well-developed and well-nourished and in no acute distress  HEENT:  Head is [place]/AT.  Sclera are anicteric.    Neck: No carotid bruits are noted.  The neck is nontender.  Cardiovascular: The heart has a regular rate and rhythm with a normal S1 and S2. There were no murmurs, gallops or rubs.    Skin: Extremities are without rash or  edema.  Neurologic Exam  Mental status: The patient is alert and oriented x  3 at the time of the examination. The patient has apparent normal recent and remote memory, with an apparently normal attention span and concentration ability.   Speech is normal.  Cranial nerves: Extraocular movements are full. Pupils are equal, round, and reactive to light and accomodation.  There is good facial sensation to soft touch bilaterally.Facial strength is normal.  Trapezius and sternocleidomastoid strength is normal. No dysarthria is noted.   No obvious hearing deficits are noted.  Motor:  Muscle bulk is normal.   Tone is normal. Strength is  5 / 5 in all 4 extremities.   Sensory: Sensory testing is intact to pinprick, soft touch and vibration sensation in the arms.  He has about 25 to 30% reduced sensation to vibration and pinprick in the toes relative to the ankles.  This is fairly symmetric.Marland Kitchen  Coordination: Cerebellar testing reveals good finger-nose-finger and heel-to-shin bilaterally.  Gait and station: Station is normal.   Gait is normal. Tandem gait is mildly wide. Romberg is negative.   Reflexes: Deep tendon reflexes are symmetric and normal bilaterally.   Plantar responses are flexor.      ASSESSMENT AND PLAN  Numbness - Plan: Vitamin B12, Multiple Myeloma Panel (SPEP&IFE w/QIG), Sjogren's syndrome antibods(ssa + ssb)  Balance disorder - Plan: Vitamin B12, Multiple Myeloma Panel (SPEP&IFE w/QIG), Sjogren's syndrome antibods(ssa + ssb)   In summary, Micheal Chandler is a 53 year old man with numbness and mild  balance issues that began 4 to 5 months ago while running daily.  The numbness has actually improved over the past 2 months during the time he has reduced his exercise.  There is no weakness.  The etiology is uncertain.  He could possibly have a minimal polyneuropathy.  He does not have diabetes or hyperglycemia.  We will check lab work for B12 deficiency, MGUS and Sjogren's.  If the lab work is negative and he continues to improve towards baseline, no further evaluation is necessary.  However, if symptoms do not improve or worsen we would need to check an NCV/EMG and also consider a cervical MRI to rule out myelopathy.  We will let him know the results of the lab work in a few days and he will call us back if he has any new or worsening neurologic symptoms.  A follow-up was not scheduled but if he has new or worsening symptoms he will give [place] a call and we will bring him in for NCV/EMG and possibly other evaluation.  Thank you for asking me to see Micheal Chandler.  Please let me know if I can be of further assistance with him or other patients in the future.   Micheal Chandler A. Felecia Shelling, MD, [facility] 96/88/6484, 72:07 AM Certified in Neurology, Clinical Neurophysiology, Sleep Medicine and Neuroimaging  [facility] [address], [place], [place] [address] [phone]

## 2019-12-05 LAB — MULTIPLE MYELOMA PANEL, SERUM
Albumin SerPl Elph-Mcnc: 4.3 g/dL (ref 2.9–4.4)
Albumin/Glob SerPl: 1.5 (ref 0.7–1.7)
Alpha 1: 0.2 g/dL (ref 0.0–0.4)
Alpha2 Glob SerPl Elph-Mcnc: 0.7 g/dL (ref 0.4–1.0)
B-Globulin SerPl Elph-Mcnc: 1 g/dL (ref 0.7–1.3)
Gamma Glob SerPl Elph-Mcnc: 1.2 g/dL (ref 0.4–1.8)
Globulin, Total: 3 g/dL (ref 2.2–3.9)
IgA/Immunoglobulin A, Serum: 261 mg/dL (ref 90–386)
IgG (Immunoglobin G), Serum: 1054 mg/dL (ref 603–1613)
IgM (Immunoglobulin M), Srm: 98 mg/dL (ref 20–172)
Total Protein: 7.3 g/dL (ref 6.0–8.5)

## 2019-12-05 LAB — SJOGREN'S SYNDROME ANTIBODS(SSA + SSB)
ENA SSA (RO) Ab: 2 AI — ABNORMAL HIGH (ref 0.0–0.9)
ENA SSB (LA) Ab: <0.2 AI (ref 0.0–0.9)

## 2019-12-05 LAB — VITAMIN B12: Vitamin B-12: 504 pg/mL (ref 232–1245)

## 2019-12-11 ENCOUNTER — Telehealth: Payer: Self-pay | Admitting: *Deleted

## 2019-12-11 DIAGNOSIS — M3500 Sicca syndrome, unspecified: Secondary | ICD-10-CM

## 2019-12-11 NOTE — Telephone Encounter (Signed)
-----   Message from Britt Bottom, MD sent at 12/11/2019  5:01 PM EST ----- Please let him know that the one laboratory test was slightly elevated (test for Sjogren's syndrome).  A mildly elevated result can be a false positive.  Is he having any difficulty with dry mouth or dry eyes?  If so, we could refer to rheumatology for evaluation of possible Sjogren's syndrome.  If not, then this is most likely a false positive.

## 2019-12-11 NOTE — Telephone Encounter (Signed)
Tried calling pt at [phone]. Got busy signal. Call mobile and spoke with pt about results per Dr. Felecia Shelling note. He has some dry eyes/mouth. He would like referral to rheumatology to be further evaluated. I placed referral and advised he will be called to get scheduled in the next week or so. If he does not, asked him to call me back.

## 2019-12-25 NOTE — Telephone Encounter (Signed)
Patient called stating he contacted [facility] and was advised referral needed to be sent through proficient or through fax since they are not within [facility]. Please follow up

## 2019-12-31 ENCOUNTER — Telehealth: Payer: Self-pay

## 2019-12-31 NOTE — Telephone Encounter (Signed)
Adrienne with [facility] called stating that while scheduling patient for an appointment patient became upset when he heard they were not [facility] and patient feels as though no one cares about his referral. She states patient hung up the phone but did not want to cancel apt as patient did not mention he would like to cancel appointment but also expressed frustration. Adrienne called requesting FU information if appointment should remain as scheduled or if it needs to be canceled.  Please follow up

## 2020-01-02 NOTE — Telephone Encounter (Signed)
I have called and spoke to Shepherd about patient's apt . I relayed to CX apt and I would sent to [facility] .  I tried to call patient  A few times phone was ringing busy.

## 2020-02-10 ENCOUNTER — Ambulatory Visit: Payer: BC Managed Care – PPO | Admitting: Rheumatology

## 2020-02-18 ENCOUNTER — Ambulatory Visit (INDEPENDENT_AMBULATORY_CARE_PROVIDER_SITE_OTHER): Payer: BC Managed Care – PPO | Admitting: Neurology

## 2020-02-18 ENCOUNTER — Other Ambulatory Visit: Payer: Self-pay

## 2020-02-18 ENCOUNTER — Encounter: Payer: BC Managed Care – PPO | Admitting: Neurology

## 2020-02-18 DIAGNOSIS — Z0289 Encounter for other administrative examinations: Secondary | ICD-10-CM

## 2020-02-18 DIAGNOSIS — R200 Anesthesia of skin: Secondary | ICD-10-CM

## 2020-02-18 DIAGNOSIS — M5417 Radiculopathy, lumbosacral region: Secondary | ICD-10-CM

## 2020-02-18 NOTE — Progress Notes (Signed)
Full Name: Micheal Chandler Gender: Male MRN #: [id number] Date of Birth: 1966/04/03    Visit Date: 02/18/2020 07:01 Age: 54 Years Examining Physician: Arlice Colt, MD  Referring Physician: Leafy Kindle, MD    History: Mr. Cong is a 54 year old man with numbness in both feet.  On examination he has mildly reduced vibration sensation in the toes compared to the ankles.  He has reduced pinprick sensation in the feet relative to the lower leg.  The lateral foot has more numbness than the dorsum in both feet.  Strength was normal.  Serology showed an elevated SSA but normal SSB.  He is also seen Leafy Kindle, PA-C/Dr. Trudie Reed of rheumatology  Nerve conduction studies: Bilateral peroneal motor responses had normal distal latencies, amplitudes and conduction velocities.  The right tibial motor response had a normal distal latency and amplitude and borderline reduced conduction velocity.  The left tibial motor response had a normal distal latency, reduced amplitude and borderline reduced conduction velocity.  Tibial F-wave responses were normal.  Bilateral sural and superficial peroneal sensory responses had normal peak latencies and amplitudes.  Electromyography: Needle EMG of selected muscles of the right leg was performed.  There were some polyphasic motor units with mild neuropathic recruitment in the EDB and AH muscles of the foot and the gluteus medius at the hip.  Other S1 innervated muscles (gastrocnemius, biceps femoris, peroneus longus) and other muscles in the leg (iliopsoas, vastus medialis, tibialis anterior) tested were normal.  There was no abnormal spontaneous activity in any of the muscles tested.  Impression: This NCV/EMG study shows the following: 1.  There is no evidence of a significant polyneuropathy. 2.  Probable mild right chronic S1 radiculopathy.  Edinson Domeier A. Felecia Shelling, MD, PhD, FAAN Certified in Neurology, Clinical Neurophysiology, Sleep Medicine, Pain Medicine and  Neuroimaging Director, [facility] [address] [place], [place] [address] [phone]  Clinical note: He denies any progression of symptoms.  We discussed getting an MRI of the lumbar spine if he has worsening back or leg pain.  He will be following up with rheumatology.  --- RAS         MNC    Nerve / Sites Muscle Latency Ref. Amplitude Ref. Rel Amp Segments Distance Velocity Ref. Area    ms ms mV mV %  cm m/s m/s mVms  R Peroneal - EDB     Ankle EDB 5.4 ?6.5 7.0 ?2.0 100 Ankle - EDB 9   23.4     Fib head EDB 12.4  5.5  78.5 Fib head - Ankle 31 44 ?44 21.4     Pop fossa EDB 14.7  4.2  75.8 Pop fossa - Fib head 10 44 ?44 18.3         Pop fossa - Ankle      L Peroneal - EDB     Ankle EDB 5.6 ?6.5 5.3 ?2.0 100 Ankle - EDB 9   18.5     Fib head EDB 12.7  5.2  99.2 Fib head - Ankle 31 44 ?44 19.8     Pop fossa EDB 15.0  4.4  84.1 Pop fossa - Fib head 10 44 ?44 17.0         Pop fossa - Ankle      R Tibial - AH     Ankle AH 4.4 ?5.8 5.0 ?4.0 100 Ankle - AH 9   26.4  Pop fossa AH 14.5  3.1  61.3 Pop fossa - Ankle 38 38 ?41 20.9  L Tibial - AH     Ankle AH 4.9 ?5.8 1.5 ?4.0 100 Ankle - AH 9   8.8     Pop fossa AH 14.9  0.9  57.2 Pop fossa - Ankle 38 38 ?41 2.8             SNC    Nerve / Sites Rec. Site Peak Lat Ref.  Amp Ref. Segments Distance    ms ms V V  cm  R Sural - Ankle (Calf)     Calf Ankle 3.8 ?4.4 13 ?6 Calf - Ankle 14  L Sural - Ankle (Calf)     Calf Ankle 4.2 ?4.4 6 ?6 Calf - Ankle 14  R Superficial peroneal - Ankle     Lat leg Ankle 4.3 ?4.4 6 ?6 Lat leg - Ankle 14  L Superficial peroneal - Ankle     Lat leg Ankle 4.2 ?4.4 8 ?6 Lat leg - Ankle 14             F  Wave    Nerve F Lat Ref.   ms ms  R Tibial - AH 51.1 ?56.0  L Tibial - AH 53.6 ?56.0         EMG Summary Table    Spontaneous MUAP Recruitment  Muscle IA Fib PSW Fasc Other Amp Dur. Poly Pattern  R.  Vastus medialis Normal None None None _______ Normal Normal Normal Normal  R. Tibialis anterior Normal None None None _______ Normal Normal Normal Normal  R. Peroneus longus Normal None None None _______ Normal Normal Normal Normal  R. Gastrocnemius (Medial head) Normal None None None _______ Normal Normal Normal Normal  R. Extensor digitorum brevis Normal None None None _______ Normal Increased 1+ Reduced  R. Abductor hallucis Normal None None None _______ Normal Increased 1+ Reduced  R. Gluteus medius Normal None None None _______ Normal Normal 1+ Reduced  R. Iliopsoas Normal None None None _______ Normal Normal Normal Normal  R. Biceps femoris (long head) Normal None None None _______ Normal Normal Normal Normal

## 2020-02-26 ENCOUNTER — Telehealth: Payer: Self-pay | Admitting: Neurology

## 2020-02-26 NOTE — Telephone Encounter (Signed)
Dr. Sater- please advise 

## 2020-02-26 NOTE — Telephone Encounter (Signed)
[employer] Auth: [id number] (exp. 02/26/20 to 08/23/20)  I spoke with the patient and he stated that he was under the impression that the MRI was not needed. Please advise. Thank you!

## 2020-03-24 ENCOUNTER — Ambulatory Visit (INDEPENDENT_AMBULATORY_CARE_PROVIDER_SITE_OTHER): Payer: BC Managed Care – PPO | Admitting: Otolaryngology

## 2020-03-24 ENCOUNTER — Other Ambulatory Visit: Payer: Self-pay

## 2020-03-24 DIAGNOSIS — J310 Chronic rhinitis: Secondary | ICD-10-CM

## 2020-03-24 NOTE — Progress Notes (Addendum)
HPI: Micheal Chandler is a 54 y.o. male who presents is referred by his PCP for evaluation of abnormality noted on CT scan of the sinus performed by his endodontist.  He apparently has been having some chronic left-sided cheek and dental pain and had surgery on the left side.  But he was having continued pain and discomfort in the left upper alveolar region that extends up into the left cheek.  He had a CT scan performed at their office and they informed him that there was some abnormality within the sinus cavity and recommended ENT evaluation.  He presents with a disc in our office today. Denies any trouble breathing through his nose.  No history of significant sinus infections.  He has had no drainage from the nose.Marland Kitchen  Past Medical History:  Diagnosis Date  . Hypercholesteremia   . Hypertension    Past Surgical History:  Procedure Laterality Date  . CHOLECYSTECTOMY    . ORIF ANKLE FRACTURE  10/15/2011   Procedure: OPEN REDUCTION INTERNAL FIXATION (ORIF) ANKLE FRACTURE;  Surgeon: Meredith Pel;  Location: [place];  Service: Orthopedics;  Laterality: Right;   Social History   Socioeconomic History  . Marital status: Married    Spouse name: Judson Roch  . Number of children: 2  . Years of education: Not on file  . Highest education level: Not on file  Occupational History  . Not on file  Tobacco Use  . Smoking status: Never Smoker  . Smokeless tobacco: Former Systems developer    Types: Chew  Substance and Sexual Activity  . Alcohol use: Yes    Comment: occ  . Drug use: No  . Sexual activity: Not on file  Other Topics Concern  . Not on file  Social History Narrative   Lives with wife   Caffeine use: 2-3 cups per day   Right handed   Social Determinants of Health   Financial Resource Strain:   . Difficulty of Paying Living Expenses:   Food Insecurity:   . Worried About Charity fundraiser in the Last Year:   . Arboriculturist in the Last Year:   Transportation Needs:   . Lexicographer (Medical):   Marland Kitchen Lack of Transportation (Non-Medical):   Physical Activity:   . Days of Exercise per Week:   . Minutes of Exercise per Session:   Stress:   . Feeling of Stress :   Social Connections:   . Frequency of Communication with Friends and Family:   . Frequency of Social Gatherings with Friends and Family:   . Attends Religious Services:   . Active Member of Clubs or Organizations:   . Attends Archivist Meetings:   Marland Kitchen Marital Status:    No family history on file. Allergies  Allergen Reactions  . Warfarin Sodium Other (See Comments)    Patient states he had difficulty breathing; pt stated, "It does not make me feel good"  . Hydrocodone     Patient states that it does not work for him   Prior to Admission medications   Medication Sig Start Date End Date Taking? Authorizing Provider  aspirin EC 81 MG tablet Take 81 mg by mouth daily.      [provider]  atenolol (TENORMIN) 100 MG tablet Take 100 mg by mouth daily.      [provider]  Cholecalciferol (VITAMIN D3 PO) Take 5,000 Units by mouth daily.    [provider]  CRANBERRY PO Take  1 capsule by mouth daily.      [provider]  gemfibrozil (LOPID) 600 MG tablet Take 600 mg by mouth daily.     [provider]  hydrochlorothiazide (HYDRODIURIL) 25 MG tablet Take 25 mg by mouth daily.      [provider]  Misc Natural Products (TART CHERRY ADVANCED PO) Take 1 tablet by mouth daily.    [provider]  Multiple Vitamin (MULTIVITAMIN PO) Take 1 tablet by mouth daily.    [provider]     Positive ROS: Otherwise negative  All other systems have been reviewed and were otherwise negative with the exception of those mentioned in the HPI and as above.  Physical Exam: Constitutional: Alert, well-appearing, no acute distress Ears: External ears without lesions or tenderness. Ear canals are clear bilaterally with intact, clear  TMs.  Nasal: External nose without lesions. Septum with minimal deformity with mild rhinitis.. Clear nasal passages.  No polyps noted.  Both middle meatus regions were clear. Oral: Lips and gums without lesions. Tongue and palate mucosa without lesions. Posterior oropharynx clear. Neck: No palpable adenopathy or masses Respiratory: Breathing comfortably  Skin: No facial/neck lesions or rash noted.  Procedures  Assessment: Left sinus abnormality noted on recent CT scan by endodontist  Plan: Unfortunately I am unable to visualize the CT disc in the office today.  I will plan on reviewing this tomorrow and call the patient back concerning results on Thursday morning.  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  I was able to review the CT scan with oral surgeon Dr. Hardie Shackleton on 03/27/2020.  This demonstrated a benign appearing cyst over the tooth roots where he had root canal surgery.  This appears benign and reactive.  No bony abnormalities noted.  No signs of sinus infection in the maxillary sinus.   Radene Journey, MD   CC:

## 2020-05-05 ENCOUNTER — Other Ambulatory Visit: Payer: Self-pay | Admitting: Neurology

## 2020-05-05 ENCOUNTER — Telehealth: Payer: Self-pay | Admitting: Neurology

## 2020-05-05 DIAGNOSIS — M5417 Radiculopathy, lumbosacral region: Secondary | ICD-10-CM

## 2020-05-05 DIAGNOSIS — M791 Myalgia, unspecified site: Secondary | ICD-10-CM

## 2020-05-05 DIAGNOSIS — R200 Anesthesia of skin: Secondary | ICD-10-CM

## 2020-05-05 NOTE — Telephone Encounter (Signed)
First, I would like to go ahead with th MRI and also some blood tests.   I put the orders in

## 2020-05-05 NOTE — Telephone Encounter (Signed)
Pt called wanting to speak to the RN about possible getting tests done before his scheduled appt. Please advise.

## 2020-05-05 NOTE — Telephone Encounter (Signed)
Called pt back to further discuss. He reports muscle soreness that is worsening. Denies joint pain. Wondering if he should have a msucle biopsy done, he was doing some research on this. He has been trying to eliminate some food from his diet to see if this helps. He never f/u with Rheumatology (they originally sent him here for EMG/NCS). He is unsure if he needs to go back to them. He did not complete MRI lumbar that was ordered 02/2020 by Dr. Felecia Shelling. He thought Dr. Felecia Shelling told him he did not need this and then was called by our schedulers a few weeks later. He asked that be clarified before scheduling but did not hear back. I do not see a phone note about this. I advised I will send message to MD and call back to let him know what Dr. Felecia Shelling recommends. He verbalized understanding.

## 2020-05-05 NOTE — Telephone Encounter (Signed)
Called pt. Relayed Dr. Garth Bigness message. He verbalized understanding. He is aware we are closed on Friday and open Monday-thurs 8-5pm and lab tech typically takes lunch 12-1pm. Aware someone will call to get MRI scheduled once approved via insurance.

## 2020-05-11 ENCOUNTER — Telehealth: Payer: Self-pay | Admitting: Neurology

## 2020-05-11 ENCOUNTER — Other Ambulatory Visit (INDEPENDENT_AMBULATORY_CARE_PROVIDER_SITE_OTHER): Payer: Self-pay

## 2020-05-11 DIAGNOSIS — Z0289 Encounter for other administrative examinations: Secondary | ICD-10-CM

## 2020-05-11 DIAGNOSIS — M791 Myalgia, unspecified site: Secondary | ICD-10-CM

## 2020-05-11 NOTE — Telephone Encounter (Signed)
I spoke to the patient on 02/26/20 and there is a telephone note stating when I spoke to the patient he was under the impression that the MRI was not needed..   I just called the patient to schedule the MRI and informed him since his deductible has not been met that it would cost him about $1,020. I did offer the payment plan. He stated that he has not got any answer on anything. He stated if he pays $1,000 he expects $1,000 dollar answer in writing. He stated he is tired of paying money and getting no answer. He stated that he wanted me to cancel his upcoming appointment with Dr. Felecia Shelling.

## 2020-05-11 NOTE — Telephone Encounter (Signed)
Pt requesting a call stating he hasn't heard anything about scheduling his MRI and is wanting to get that done before his upcoming appt on 6/25 please advise

## 2020-05-11 NOTE — Telephone Encounter (Signed)
Since his symptoms were worsening, I ordered the MRI.  It he does not want to get thsi, I can see him back as needed if symptoms continue to worsen

## 2020-05-12 ENCOUNTER — Telehealth: Payer: Self-pay | Admitting: *Deleted

## 2020-05-12 LAB — C-REACTIVE PROTEIN: CRP: 2 mg/L (ref 0–10)

## 2020-05-12 LAB — CK: Total CK: 118 U/L (ref 41–331)

## 2020-05-12 LAB — SEDIMENTATION RATE: Sed Rate: 2 mm/hr (ref 0–30)

## 2020-05-12 NOTE — Telephone Encounter (Signed)
I called pt and relayed Dr. Garth Bigness message. He verbalized understanding. He relayed that due to cost issue, he is not going to have MRI right now. Advised him to call back if he has any new or worsening sx.

## 2020-05-12 NOTE — Telephone Encounter (Signed)
-----   Message from Britt Bottom, MD sent at 05/12/2020  2:18 PM EDT ----- Please let the patient know that the lab work is fine.

## 2020-05-12 NOTE — Telephone Encounter (Signed)
Called and spoke with pt about results per Dr. Sater note. Pt verbalized understanding. 

## 2020-05-29 ENCOUNTER — Ambulatory Visit: Payer: BC Managed Care – PPO | Admitting: Neurology

## 2020-06-03 ENCOUNTER — Ambulatory Visit: Payer: BC Managed Care – PPO | Admitting: Neurology

## 2020-08-17 ENCOUNTER — Telehealth: Payer: Self-pay | Admitting: Neurology

## 2020-08-17 NOTE — Telephone Encounter (Signed)
Pt called, need code to add physician to app [facility]). Would like a call from the nurse to help Pt add [facility] to list of providers.

## 2020-08-17 NOTE — Telephone Encounter (Addendum)
Called, LVM for pt to call back to further discuss. We are not familiar with this app/unsure what code he is needing.  It appears [facility] app is only with [employer]. He will need to sign up for [facility] app for Dr. Felecia Shelling. Please let him know if he calls.

## 2020-09-22 ENCOUNTER — Other Ambulatory Visit: Payer: Self-pay | Admitting: Family Medicine

## 2020-09-22 ENCOUNTER — Ambulatory Visit
Admission: RE | Admit: 2020-09-22 | Discharge: 2020-09-22 | Disposition: A | Discharge status: Home / Self Care | Payer: BC Managed Care – PPO | Source: Ambulatory Visit | Attending: Family Medicine | Admitting: Family Medicine

## 2020-09-22 DIAGNOSIS — M79671 Pain in right foot: Secondary | ICD-10-CM

## 2021-08-31 ENCOUNTER — Encounter: Payer: Self-pay | Admitting: Gastroenterology

## 2021-10-22 ENCOUNTER — Other Ambulatory Visit: Payer: Self-pay

## 2021-10-22 ENCOUNTER — Ambulatory Visit (AMBULATORY_SURGERY_CENTER): Payer: BC Managed Care – PPO

## 2021-10-22 VITALS — Ht 71.0 in | Wt 303.0 lb

## 2021-10-22 DIAGNOSIS — Z1211 Encounter for screening for malignant neoplasm of colon: Secondary | ICD-10-CM

## 2021-10-22 MED ORDER — PEG 3350-KCL-NA BICARB-NACL 420 G PO SOLR: 4000.0000 mL | Freq: Once | ORAL | 0 refills | Status: AC

## 2021-10-22 NOTE — Progress Notes (Signed)
No allergies to soy or egg Pt is not on blood thinners or diet pills Denies issues with sedation/intubation Denies atrial flutter/fib Denies constipation   Pt is aware of Covid safety and care partner requirements.      

## 2021-10-25 ENCOUNTER — Encounter: Payer: Self-pay | Admitting: Gastroenterology

## 2021-11-05 ENCOUNTER — Ambulatory Visit (AMBULATORY_SURGERY_CENTER): Payer: BC Managed Care – PPO | Admitting: Gastroenterology

## 2021-11-05 ENCOUNTER — Encounter: Payer: Self-pay | Admitting: Gastroenterology

## 2021-11-05 VITALS — BP 127/71 | HR 57 | Temp 97.7°F | Resp 22 | Ht 71.0 in | Wt 303.0 lb

## 2021-11-05 DIAGNOSIS — D127 Benign neoplasm of rectosigmoid junction: Secondary | ICD-10-CM | POA: Diagnosis not present

## 2021-11-05 DIAGNOSIS — K635 Polyp of colon: Secondary | ICD-10-CM

## 2021-11-05 DIAGNOSIS — Z1211 Encounter for screening for malignant neoplasm of colon: Secondary | ICD-10-CM

## 2021-11-05 DIAGNOSIS — D124 Benign neoplasm of descending colon: Secondary | ICD-10-CM

## 2021-11-05 MED ORDER — SODIUM CHLORIDE 0.9 % IV SOLN: 500.0000 mL | Freq: Once | INTRAVENOUS | Status: DC

## 2021-11-05 NOTE — Progress Notes (Signed)
Sedate, gd SR, tolerated procedure well, VSS, report to RN 

## 2021-11-05 NOTE — Progress Notes (Signed)
Pt's states no medical or surgical changes since previsit or office visit. 

## 2021-11-05 NOTE — Op Note (Signed)
[facility] Patient Name: Micheal Chandler Procedure Date: 11/05/2021 4:06 PM MRN: [id number] Endoscopist: Remo Lipps P. Havery Moros , MD Age: 55 Referring MD:  Date of Birth: 1966-03-09 Gender: Male Account #: [id number] Procedure:                Colonoscopy Indications:              Screening for colorectal malignant neoplasm, This                            is the patient's first colonoscopy Medicines:                Monitored Anesthesia Care Procedure:                Pre-Anesthesia Assessment:                           - Prior to the procedure, a History and Physical                            was performed, and patient medications and                            allergies were reviewed. The patient's tolerance of                            previous anesthesia was also reviewed. The risks                            and benefits of the procedure and the sedation                            options and risks were discussed with the patient.                            All questions were answered, and informed consent                            was obtained. Prior Anticoagulants: The patient has                            taken no previous anticoagulant or antiplatelet                            agents. ASA Grade Assessment: II - A patient with                            mild systemic disease. After reviewing the risks                            and benefits, the patient was deemed in                            satisfactory condition to undergo the procedure.  After obtaining informed consent, the colonoscope                            was passed under direct vision. Throughout the                            procedure, the patient's blood pressure, pulse, and                            oxygen saturations were monitored continuously. The                            Colonoscope was introduced through the anus and                            advanced to the the  cecum, identified by                            appendiceal orifice and ileocecal valve. The                            colonoscopy was performed without difficulty. The                            patient tolerated the procedure well. The quality                            of the bowel preparation was adequate. The                            ileocecal valve, appendiceal orifice, and rectum                            were photographed. Scope In: 4:19:53 PM Scope Out: 4:42:15 PM Scope Withdrawal Time: 0 hours 19 minutes 23 seconds  Total Procedure Duration: 0 hours 22 minutes 22 seconds  Findings:                 The perianal and digital rectal examinations were                            normal.                           A 3 mm polyp was found in the descending colon. The                            polyp was sessile. The polyp was removed with a                            cold snare. Resection and retrieval were complete.                           A 3 to 4 mm polyp was found in the recto-sigmoid  colon. The polyp was sessile. The polyp was removed                            with a cold snare. Resection and retrieval were                            complete.                           Internal hemorrhoids were found during retroflexion.                           The exam was otherwise without abnormality. Prep                            was adequate but right colon took a few minutes to                            lavage / clear. Complications:            No immediate complications. Estimated blood loss:                            Minimal. Estimated Blood Loss:     Estimated blood loss was minimal. Impression:               - One 3 mm polyp in the descending colon, removed                            with a cold snare. Resected and retrieved.                           - One 3 to 4 mm polyp at the recto-sigmoid colon,                            removed with a cold  snare. Resected and retrieved.                           - Internal hemorrhoids.                           - The examination was otherwise normal. Recommendation:           - Patient has a contact number available for                            emergencies. The signs and symptoms of potential                            delayed complications were discussed with the                            patient. Return to normal activities tomorrow.  Written discharge instructions were provided to the                            patient.                           - Resume previous diet.                           - Continue present medications.                           - Await pathology results. Remo Lipps P. Dimitria Ketchum, MD 11/05/2021 4:46:32 PM This report has been signed electronically.

## 2021-11-05 NOTE — Progress Notes (Signed)
[place] relieves Powers CRNA

## 2021-11-05 NOTE — Patient Instructions (Addendum)
Handout on polyps given to you today   YOU HAD AN ENDOSCOPIC PROCEDURE TODAY AT [facility]:   Refer to the procedure report that was given to you for any specific questions about what was found during the examination.  If the procedure report does not answer your questions, please call your gastroenterologist to clarify.  If you requested that your care partner not be given the details of your procedure findings, then the procedure report has been included in a sealed envelope for you to review at your convenience later.  YOU SHOULD EXPECT: Some feelings of bloating in the abdomen. Passage of more gas than usual.  Walking can help get rid of the air that was put into your GI tract during the procedure and reduce the bloating. If you had a lower endoscopy (such as a colonoscopy or flexible sigmoidoscopy) you may notice spotting of blood in your stool or on the toilet paper. If you underwent a bowel prep for your procedure, you may not have a normal bowel movement for a few days.  Please Note:  You might notice some irritation and congestion in your nose or some drainage.  This is from the oxygen used during your procedure.  There is no need for concern and it should clear up in a day or so.  SYMPTOMS TO REPORT IMMEDIATELY:  Following lower endoscopy (colonoscopy or flexible sigmoidoscopy):  Excessive amounts of blood in the stool  Significant tenderness or worsening of abdominal pains  Swelling of the abdomen that is new, acute  Fever of 100F or higher  For urgent or emergent issues, a gastroenterologist can be reached at any hour by calling [phone]. Do not use MyChart messaging for urgent concerns.    DIET:  We do recommend a small meal at first, but then you may proceed to your regular diet.  Drink plenty of fluids but you should avoid alcoholic beverages for 24 hours.  ACTIVITY:  You should plan to take it easy for the rest of today and you should NOT DRIVE or use  heavy machinery until tomorrow (because of the sedation medicines used during the test).    FOLLOW UP: Our staff will call the number listed on your records 48-72 hours following your procedure to check on you and address any questions or concerns that you may have regarding the information given to you following your procedure. If we do not reach you, we will leave a message.  We will attempt to reach you two times.  During this call, we will ask if you have developed any symptoms of COVID 19. If you develop any symptoms (ie: fever, flu-like symptoms, shortness of breath, cough etc.) before then, please call [phone].  If you test positive for Covid 19 in the 2 weeks post procedure, please call and report this information to [place].    If any biopsies were taken you will be contacted by phone or by letter within the next 1-3 weeks.  Please call us at [phone] if you have not heard about the biopsies in 3 weeks.    SIGNATURES/CONFIDENTIALITY: You and/or your care partner have signed paperwork which will be entered into your electronic medical record.  These signatures attest to the fact that that the information above on your After Visit Summary has been reviewed and is understood.  Full responsibility of the confidentiality of this discharge information lies with you and/or your care-partner.

## 2021-11-05 NOTE — Progress Notes (Signed)
DS.,T. vital signs.

## 2021-11-05 NOTE — Progress Notes (Signed)
[facility] History and Physical   Primary Care Physician:  Donald Prose, MD   Reason for Procedure:   Colon cancer screening  Plan:    colonoscopy     HPI: Micheal Chandler is a 55 y.o. male  here for colonoscopy screening - first time exam. Patient denies any bowel symptoms at this time. No family history of colon cancer known. Otherwise feels well without any cardiopulmonary symptoms.    Past Medical History:  Diagnosis Date   Hypercholesteremia    Hypertension     Past Surgical History:  Procedure Laterality Date   CHOLECYSTECTOMY     ORIF ANKLE FRACTURE  10/15/2011   Procedure: OPEN REDUCTION INTERNAL FIXATION (ORIF) ANKLE FRACTURE;  Surgeon: Meredith Pel;  Location: [facility];  Service: Orthopedics;  Laterality: Right;    Prior to Admission medications   Medication Sig Start Date End Date Taking? Authorizing Provider  Ascorbic Acid (VITAMIN C) 500 MG CAPS See admin instructions.   Yes [provider]  aspirin EC 81 MG tablet Take 81 mg by mouth daily.     Yes [provider]  atenolol (TENORMIN) 100 MG tablet Take 100 mg by mouth daily.     Yes [provider]  Cholecalciferol (VITAMIN D3 PO) Take 5,000 Units by mouth daily.   Yes [provider]  Coenzyme Q10 (COQ10 PO) Take by mouth.   Yes [provider]  CRANBERRY PO Take 1 capsule by mouth daily.     Yes [provider]  GARLIC PO Take by mouth.   Yes [provider]  gemfibrozil (LOPID) 600 MG tablet Take 600 mg by mouth daily.    Yes [provider]  hydrochlorothiazide (HYDRODIURIL) 25 MG tablet Take 25 mg by mouth daily.     Yes [provider]  Misc Natural Products (TART CHERRY ADVANCED PO) Take 1 tablet by mouth daily.   Yes [provider]  Multiple Vitamin (MULTI-VITAMIN PO) Take by mouth.   Yes [provider]  Multiple Vitamin (MULTIVITAMIN PO) Take 1 tablet by mouth daily.   Yes [provider]  NON FORMULARY AC Glutathione   Yes [provider]  NON FORMULARY Curamed   Yes [provider]  NON FORMULARY Quercitin   Yes [provider]  Omega-3 Fatty Acids (FISH OIL) 1000 MG CAPS Take by mouth.   Yes [provider]  tadalafil (CIALIS) 5 MG tablet SMARTSIG:1-4 Tablet(s) By Mouth PRN 09/29/21  Yes [provider]  Thiamine HCl (VITAMIN B1 PO)    Yes [provider]  Turmeric 500 MG CAPS See admin instructions.   Yes [provider]  vitamin B-12 (CYANOCOBALAMIN) 1000 MCG tablet See admin instructions.   Yes [provider]  vitamin E 1000 UNIT capsule Take 1,000 Units by mouth daily.   Yes [provider]  vitamin k 100 MCG tablet Take 100 mcg by mouth daily.   Yes [provider]  Zinc 50 MG TABS 1 tablet   Yes [provider]    Current Outpatient Medications  Medication Sig Dispense Refill   Ascorbic Acid (VITAMIN C) 500 MG CAPS See admin instructions.     aspirin EC 81 MG tablet Take 81 mg by mouth daily.       atenolol (TENORMIN) 100 MG tablet Take 100 mg by mouth daily.       Cholecalciferol (VITAMIN D3 PO) Take 5,000 Units by mouth daily.     Coenzyme Q10 (COQ10  PO) Take by mouth.     CRANBERRY PO Take 1 capsule by mouth daily.       GARLIC PO Take by mouth.     gemfibrozil (LOPID) 600 MG tablet Take 600 mg by mouth daily.      hydrochlorothiazide (HYDRODIURIL) 25 MG tablet Take 25 mg by mouth daily.       Misc Natural Products (TART CHERRY ADVANCED PO) Take 1 tablet by mouth daily.     Multiple Vitamin (MULTI-VITAMIN PO) Take by mouth.     Multiple Vitamin (MULTIVITAMIN PO) Take 1 tablet by mouth daily.     NON FORMULARY AC Glutathione     NON FORMULARY Curamed     NON FORMULARY Quercitin     Omega-3 Fatty Acids (FISH OIL) 1000 MG CAPS Take by mouth.     tadalafil (CIALIS) 5 MG tablet SMARTSIG:1-4 Tablet(s) By Mouth PRN     Thiamine HCl (VITAMIN B1 PO)       Turmeric 500 MG CAPS See admin instructions.     vitamin B-12 (CYANOCOBALAMIN) 1000 MCG tablet See admin instructions.     vitamin E 1000 UNIT capsule Take 1,000 Units by mouth daily.     vitamin k 100 MCG tablet Take 100 mcg by mouth daily.     Zinc 50 MG TABS 1 tablet     Current Facility-Administered Medications  Medication Dose Route Frequency Provider Last Rate Last Admin   0.9 %  sodium chloride infusion  500 mL Intravenous Once Zophia Marrone, Carlota Raspberry, MD        Allergies as of 11/05/2021 - Review Complete 11/05/2021  Allergen Reaction Noted   Warfarin sodium Other (See Comments) 12/18/2014   Hydrocodone  10/15/2011    Family History  Problem Relation Age of Onset   Colon cancer Neg Hx    Colon polyps Neg Hx    Esophageal cancer Neg Hx    Stomach cancer Neg Hx    Rectal cancer Neg Hx     Social History   Socioeconomic History   Marital status: Married    Spouse name: Judson Roch   Number of children: 2   Years of education: Not on file   Highest education level: Not on file  Occupational History   Not on file  Tobacco Use   Smoking status: Never   Smokeless tobacco: Former    Types: Nurse, children's Use: Never used  Substance and Sexual Activity   Alcohol use: Yes    Comment: occ   Drug use: No   Sexual activity: Not on file  Other Topics Concern   Not on file  Social History Narrative   Lives with wife   Caffeine use: 2-3 cups per day   Right handed   Social Determinants of Health   Financial Resource Strain: Not on file  Food Insecurity: Not on file  Transportation Needs: Not on file  Physical Activity: Not on file  Stress: Not on file  Social Connections: Not on file  Intimate Partner Violence: Not on file    Review of Systems: All other review of systems negative except as mentioned in the HPI.  Physical Exam: Vital signs BP 126/81   Pulse 71   Temp 97.7 F (36.5 C)   Ht 5\' 11"  (1.803 m)   Wt (!) 303 lb (137.4 kg)   SpO2 96%    BMI 42.26 kg/m   General:   Alert,  Well-developed, pleasant and cooperative in NAD Lungs:  Clear throughout to auscultation.   Heart:  Regular rate and rhythm Abdomen:  Soft, nontender and nondistended.   Neuro/Psych:  Alert and cooperative. Normal mood and affect. A and O x 3  Jolly Mango, MD [facility]

## 2021-11-05 NOTE — Progress Notes (Signed)
Called to room to assist during endoscopic procedure.  Patient ID and intended procedure confirmed with present staff. Received instructions for my participation in the procedure from the performing physician.  

## 2021-11-09 ENCOUNTER — Telehealth: Payer: Self-pay

## 2021-11-09 NOTE — Telephone Encounter (Signed)
  Follow up Call-  Call back number 11/05/2021  Post procedure Call Back phone  # [phone]  Permission to leave phone message Yes  Some recent data might be hidden     Patient questions:  Do you have a fever, pain , or abdominal swelling? No. Pain Score  0 *  Have you tolerated food without any problems? Yes.    Have you been able to return to your normal activities? Yes.    Do you have any questions about your discharge instructions: Diet   No. Medications  No. Follow up visit  No.  Do you have questions or concerns about your Care? No.  Actions: * If pain score is 4 or above: No action needed, pain <4. Have you developed a fever since your procedure? no  2.   Have you had an respiratory symptoms (SOB or cough) since your procedure? no  3.   Have you tested positive for COVID 19 since your procedure no  4.   Have you had any family members/close contacts diagnosed with the COVID 19 since your procedure?  no   If yes to any of these questions please route to Joylene John, RN and Joella Prince, RN

## 2022-04-18 LAB — COMPREHENSIVE METABOLIC PANEL
Albumin: 4.6 (ref 3.5–5.0)
Calcium: 9.7 (ref 8.7–10.7)
eGFR: 94

## 2022-04-18 LAB — BASIC METABOLIC PANEL
BUN: 16 (ref 4–21)
CO2: 32 — AB (ref 13–22)
Chloride: 103 (ref 99–108)
Creatinine: 1 (ref 0.6–1.3)
Glucose: 81
Potassium: 4.3 mEq/L (ref 3.5–5.1)
Sodium: 141 (ref 137–147)

## 2022-04-18 LAB — LIPID PANEL
Cholesterol: 134 (ref 0–200)
HDL: 39 (ref 35–70)
LDL Cholesterol: 73
LDl/HDL Ratio: 3.4
Triglycerides: 121 (ref 40–160)

## 2022-04-18 LAB — PSA: PSA: 1.97

## 2022-04-18 LAB — HEPATIC FUNCTION PANEL
ALT: 23 U/L (ref 10–40)
AST: 19 (ref 14–40)

## 2022-05-04 ENCOUNTER — Emergency Department (HOSPITAL_COMMUNITY): Payer: BC Managed Care – PPO

## 2022-05-04 ENCOUNTER — Other Ambulatory Visit: Payer: Self-pay

## 2022-05-04 ENCOUNTER — Emergency Department (HOSPITAL_COMMUNITY)
Admission: EM | Admit: 2022-05-04 | Discharge: 2022-05-04 | Disposition: A | Discharge status: Home / Self Care | Payer: BC Managed Care – PPO | Attending: Emergency Medicine | Admitting: Emergency Medicine

## 2022-05-04 ENCOUNTER — Encounter (HOSPITAL_COMMUNITY): Payer: Self-pay

## 2022-05-04 DIAGNOSIS — Z79899 Other long term (current) drug therapy: Secondary | ICD-10-CM | POA: Diagnosis not present

## 2022-05-04 DIAGNOSIS — S42212A Unspecified displaced fracture of surgical neck of left humerus, initial encounter for closed fracture: Secondary | ICD-10-CM

## 2022-05-04 DIAGNOSIS — Z7982 Long term (current) use of aspirin: Secondary | ICD-10-CM | POA: Insufficient documentation

## 2022-05-04 DIAGNOSIS — I10 Essential (primary) hypertension: Secondary | ICD-10-CM | POA: Insufficient documentation

## 2022-05-04 DIAGNOSIS — S4992XA Unspecified injury of left shoulder and upper arm, initial encounter: Secondary | ICD-10-CM | POA: Diagnosis present

## 2022-05-04 DIAGNOSIS — Y92410 Unspecified street and highway as the place of occurrence of the external cause: Secondary | ICD-10-CM | POA: Diagnosis not present

## 2022-05-04 MED ORDER — OXYCODONE-ACETAMINOPHEN 5-325 MG PO TABS: 1.0000 | ORAL_TABLET | Freq: Four times a day (QID) | ORAL | 0 refills | Status: DC | PRN

## 2022-05-04 MED ORDER — OXYCODONE-ACETAMINOPHEN 5-325 MG PO TABS
2.0000 | ORAL_TABLET | Freq: Once | ORAL | Status: AC
  Administered 2022-05-04: 2 via ORAL
  Filled 2022-05-04: qty 2

## 2022-05-04 NOTE — Discharge Instructions (Addendum)
Continue the naproxen every 12 hours and then use the pain medication 1 or 2 tabs every 4-6 hours
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
## 2022-05-09 HISTORY — PX: ROTATOR CUFF REPAIR: SHX139

## 2022-05-18 ENCOUNTER — Observation Stay (HOSPITAL_COMMUNITY)
Admission: EM | Admit: 2022-05-18 | Discharge: 2022-05-21 | Disposition: A | Discharge status: Home / Self Care | Payer: BC Managed Care – PPO | Attending: Internal Medicine | Admitting: Internal Medicine

## 2022-05-18 ENCOUNTER — Other Ambulatory Visit: Payer: Self-pay

## 2022-05-18 ENCOUNTER — Encounter (HOSPITAL_COMMUNITY): Payer: Self-pay

## 2022-05-18 DIAGNOSIS — F17210 Nicotine dependence, cigarettes, uncomplicated: Secondary | ICD-10-CM | POA: Diagnosis not present

## 2022-05-18 DIAGNOSIS — Z7982 Long term (current) use of aspirin: Secondary | ICD-10-CM | POA: Insufficient documentation

## 2022-05-18 DIAGNOSIS — N12 Tubulo-interstitial nephritis, not specified as acute or chronic: Secondary | ICD-10-CM | POA: Diagnosis not present

## 2022-05-18 DIAGNOSIS — Z9889 Other specified postprocedural states: Secondary | ICD-10-CM

## 2022-05-18 DIAGNOSIS — E876 Hypokalemia: Secondary | ICD-10-CM | POA: Insufficient documentation

## 2022-05-18 DIAGNOSIS — Z6841 Body Mass Index (BMI) 40.0 and over, adult: Secondary | ICD-10-CM | POA: Diagnosis not present

## 2022-05-18 DIAGNOSIS — Y92410 Unspecified street and highway as the place of occurrence of the external cause: Secondary | ICD-10-CM

## 2022-05-18 DIAGNOSIS — E7800 Pure hypercholesterolemia, unspecified: Secondary | ICD-10-CM | POA: Insufficient documentation

## 2022-05-18 DIAGNOSIS — E871 Hypo-osmolality and hyponatremia: Secondary | ICD-10-CM | POA: Diagnosis present

## 2022-05-18 DIAGNOSIS — A419 Sepsis, unspecified organism: Principal | ICD-10-CM | POA: Diagnosis present

## 2022-05-18 DIAGNOSIS — I10 Essential (primary) hypertension: Secondary | ICD-10-CM | POA: Insufficient documentation

## 2022-05-18 DIAGNOSIS — Z79899 Other long term (current) drug therapy: Secondary | ICD-10-CM | POA: Insufficient documentation

## 2022-05-18 DIAGNOSIS — B9620 Unspecified Escherichia coli [E. coli] as the cause of diseases classified elsewhere: Secondary | ICD-10-CM | POA: Diagnosis not present

## 2022-05-18 DIAGNOSIS — F17220 Nicotine dependence, chewing tobacco, uncomplicated: Secondary | ICD-10-CM | POA: Diagnosis present

## 2022-05-18 DIAGNOSIS — E66813 Obesity, class 3: Secondary | ICD-10-CM | POA: Diagnosis present

## 2022-05-18 DIAGNOSIS — S42212D Unspecified displaced fracture of surgical neck of left humerus, subsequent encounter for fracture with routine healing: Secondary | ICD-10-CM | POA: Insufficient documentation

## 2022-05-18 DIAGNOSIS — S42309A Unspecified fracture of shaft of humerus, unspecified arm, initial encounter for closed fracture: Secondary | ICD-10-CM | POA: Diagnosis present

## 2022-05-18 LAB — CBC WITH DIFFERENTIAL/PLATELET
Abs Immature Granulocytes: 0.19 10*3/uL — ABNORMAL HIGH (ref 0.00–0.07)
Basophils Absolute: 0.1 10*3/uL (ref 0.0–0.1)
Basophils Relative: 1 %
Eosinophils Absolute: 0 10*3/uL (ref 0.0–0.5)
Eosinophils Relative: 0 %
HCT: 40.6 % (ref 39.0–52.0)
Hemoglobin: 14.2 g/dL (ref 13.0–17.0)
Immature Granulocytes: 1 %
Lymphocytes Relative: 8 %
Lymphs Abs: 1.5 10*3/uL (ref 0.7–4.0)
MCH: 31.4 pg (ref 26.0–34.0)
MCHC: 35 g/dL (ref 30.0–36.0)
MCV: 89.8 fL (ref 80.0–100.0)
Monocytes Absolute: 2.2 10*3/uL — ABNORMAL HIGH (ref 0.1–1.0)
Monocytes Relative: 12 %
Neutro Abs: 14.9 10*3/uL — ABNORMAL HIGH (ref 1.7–7.7)
Neutrophils Relative %: 78 %
Platelets: 317 10*3/uL (ref 150–400)
RBC: 4.52 MIL/uL (ref 4.22–5.81)
RDW: 13.1 % (ref 11.5–15.5)
WBC: 18.9 10*3/uL — ABNORMAL HIGH (ref 4.0–10.5)
nRBC: 0 % (ref 0.0–0.2)

## 2022-05-18 LAB — URINALYSIS, ROUTINE W REFLEX MICROSCOPIC
Bilirubin Urine: NEGATIVE
Glucose, UA: NEGATIVE mg/dL
Ketones, ur: NEGATIVE mg/dL
Nitrite: POSITIVE — AB
Protein, ur: 100 mg/dL — AB
Specific Gravity, Urine: 1.02 (ref 1.005–1.030)
WBC, UA: >50 WBC/hpf — ABNORMAL HIGH (ref 0–5)
pH: 5 (ref 5.0–8.0)

## 2022-05-18 LAB — BASIC METABOLIC PANEL
Anion gap: 15 (ref 5–15)
BUN: 19 mg/dL (ref 6–20)
CO2: 19 mmol/L — ABNORMAL LOW (ref 22–32)
Calcium: 9.4 mg/dL (ref 8.9–10.3)
Chloride: 99 mmol/L (ref 98–111)
Creatinine, Ser: 1.03 mg/dL (ref 0.61–1.24)
GFR, Estimated: >60 mL/min (ref >60)
Glucose, Bld: 116 mg/dL — ABNORMAL HIGH (ref 70–99)
Potassium: 3.2 mmol/L — ABNORMAL LOW (ref 3.5–5.1)
Sodium: 133 mmol/L — ABNORMAL LOW (ref 135–145)

## 2022-05-18 MED ORDER — IBUPROFEN 800 MG PO TABS
800.0000 mg | ORAL_TABLET | Freq: Once | ORAL | Status: AC
  Administered 2022-05-18: 800 mg via ORAL
  Filled 2022-05-18: qty 1

## 2022-05-18 NOTE — ED Triage Notes (Addendum)
Pt to ED POV from home.  Pt c/o pain with urination, urinary frequency, and blood in urine.  Pt had L arm surgery two weeks ago, did not have a catheter.  Pt seen for same and has been taking Azo with some relief.  Pt had temp of 103.5 at home.  Pt had aleve at 0630 today.

## 2022-05-18 NOTE — ED Provider Triage Note (Signed)
Emergency Medicine Provider Triage Evaluation Note  Micheal Chandler , a 56 y.o. male  was evaluated in triage.  Pt complains of urinary frequency, dysuria of several day duration.  States he reached out to his urologist who recommended Azo.  He states dysuria improved however he had red discoloration to his urine after taking the Azo.  Reports fever of 103 today.  States he was asked to come in for evaluation for UTI.  He is status post recent surgery where he went surgical fixation for his humerus fracture.  This was on 6/6.  States he was not catheterized for this procedure  Review of Systems  Positive: As above Negative: As above  Physical Exam  BP 136/76 (BP Location: Right Arm)   Pulse 92   Temp 99.7 F (37.6 C) (Oral)   Resp 20   Ht '5\' 11"'$  (1.803 m)   Wt (!) 137.4 kg   SpO2 97%   BMI 42.26 kg/m  Gen:   Awake, no distress   Resp:  Normal effort  MSK:   Moves extremities without difficulty  Other:    Medical Decision Making  Medically screening exam initiated at 7:41 PM.  Appropriate orders placed.  Micheal Chandler was informed that the remainder of the evaluation will be completed by another provider, this initial triage assessment does not replace that evaluation, and the importance of remaining in the ED until their evaluation is complete.     Evlyn Courier, PA-C 05/18/22 1943

## 2022-05-19 ENCOUNTER — Inpatient Hospital Stay (HOSPITAL_COMMUNITY): Payer: BC Managed Care – PPO

## 2022-05-19 DIAGNOSIS — E871 Hypo-osmolality and hyponatremia: Secondary | ICD-10-CM | POA: Diagnosis present

## 2022-05-19 DIAGNOSIS — A419 Sepsis, unspecified organism: Secondary | ICD-10-CM | POA: Diagnosis present

## 2022-05-19 DIAGNOSIS — F17220 Nicotine dependence, chewing tobacco, uncomplicated: Secondary | ICD-10-CM | POA: Diagnosis present

## 2022-05-19 DIAGNOSIS — E876 Hypokalemia: Secondary | ICD-10-CM | POA: Diagnosis present

## 2022-05-19 DIAGNOSIS — N390 Urinary tract infection, site not specified: Secondary | ICD-10-CM

## 2022-05-19 DIAGNOSIS — I10 Essential (primary) hypertension: Secondary | ICD-10-CM | POA: Diagnosis not present

## 2022-05-19 DIAGNOSIS — S42212D Unspecified displaced fracture of surgical neck of left humerus, subsequent encounter for fracture with routine healing: Secondary | ICD-10-CM | POA: Diagnosis not present

## 2022-05-19 DIAGNOSIS — S42209D Unspecified fracture of upper end of unspecified humerus, subsequent encounter for fracture with routine healing: Secondary | ICD-10-CM | POA: Diagnosis not present

## 2022-05-19 DIAGNOSIS — N12 Tubulo-interstitial nephritis, not specified as acute or chronic: Secondary | ICD-10-CM | POA: Diagnosis present

## 2022-05-19 DIAGNOSIS — B9620 Unspecified Escherichia coli [E. coli] as the cause of diseases classified elsewhere: Secondary | ICD-10-CM | POA: Diagnosis present

## 2022-05-19 DIAGNOSIS — E7800 Pure hypercholesterolemia, unspecified: Secondary | ICD-10-CM | POA: Diagnosis present

## 2022-05-19 DIAGNOSIS — Y92410 Unspecified street and highway as the place of occurrence of the external cause: Secondary | ICD-10-CM | POA: Diagnosis not present

## 2022-05-19 DIAGNOSIS — Z7982 Long term (current) use of aspirin: Secondary | ICD-10-CM | POA: Diagnosis not present

## 2022-05-19 DIAGNOSIS — Z79899 Other long term (current) drug therapy: Secondary | ICD-10-CM | POA: Diagnosis not present

## 2022-05-19 DIAGNOSIS — S42309A Unspecified fracture of shaft of humerus, unspecified arm, initial encounter for closed fracture: Secondary | ICD-10-CM | POA: Diagnosis present

## 2022-05-19 DIAGNOSIS — Z6841 Body Mass Index (BMI) 40.0 and over, adult: Secondary | ICD-10-CM | POA: Diagnosis not present

## 2022-05-19 HISTORY — DX: Sepsis, unspecified organism: A41.9

## 2022-05-19 LAB — COMPREHENSIVE METABOLIC PANEL
ALT: 16 U/L (ref 0–44)
AST: 19 U/L (ref 15–41)
Albumin: 3.4 g/dL — ABNORMAL LOW (ref 3.5–5.0)
Alkaline Phosphatase: 62 U/L (ref 38–126)
Anion gap: 11 (ref 5–15)
BUN: 21 mg/dL — ABNORMAL HIGH (ref 6–20)
CO2: 25 mmol/L (ref 22–32)
Calcium: 8.8 mg/dL — ABNORMAL LOW (ref 8.9–10.3)
Chloride: 99 mmol/L (ref 98–111)
Creatinine, Ser: 1.13 mg/dL (ref 0.61–1.24)
GFR, Estimated: >60 mL/min (ref >60)
Glucose, Bld: 108 mg/dL — ABNORMAL HIGH (ref 70–99)
Potassium: 3.8 mmol/L (ref 3.5–5.1)
Sodium: 135 mmol/L (ref 135–145)
Total Bilirubin: 2.3 mg/dL — ABNORMAL HIGH (ref 0.3–1.2)
Total Protein: 6.5 g/dL (ref 6.5–8.1)

## 2022-05-19 LAB — LACTIC ACID, PLASMA
Lactic Acid, Venous: 1.2 mmol/L (ref 0.5–1.9)
Lactic Acid, Venous: 1.3 mmol/L (ref 0.5–1.9)

## 2022-05-19 LAB — CBC WITH DIFFERENTIAL/PLATELET
Abs Immature Granulocytes: 0.13 10*3/uL — ABNORMAL HIGH (ref 0.00–0.07)
Basophils Absolute: 0.1 10*3/uL (ref 0.0–0.1)
Basophils Relative: 0 %
Eosinophils Absolute: 0 10*3/uL (ref 0.0–0.5)
Eosinophils Relative: 0 %
HCT: 39.1 % (ref 39.0–52.0)
Hemoglobin: 13.8 g/dL (ref 13.0–17.0)
Immature Granulocytes: 1 %
Lymphocytes Relative: 8 %
Lymphs Abs: 1.6 10*3/uL (ref 0.7–4.0)
MCH: 31.6 pg (ref 26.0–34.0)
MCHC: 35.3 g/dL (ref 30.0–36.0)
MCV: 89.5 fL (ref 80.0–100.0)
Monocytes Absolute: 2.7 10*3/uL — ABNORMAL HIGH (ref 0.1–1.0)
Monocytes Relative: 13 %
Neutro Abs: 16.1 10*3/uL — ABNORMAL HIGH (ref 1.7–7.7)
Neutrophils Relative %: 78 %
Platelets: 316 10*3/uL (ref 150–400)
RBC: 4.37 MIL/uL (ref 4.22–5.81)
RDW: 13.3 % (ref 11.5–15.5)
WBC: 20.6 10*3/uL — ABNORMAL HIGH (ref 4.0–10.5)
nRBC: 0 % (ref 0.0–0.2)

## 2022-05-19 LAB — CK: Total CK: 83 U/L (ref 49–397)

## 2022-05-19 LAB — HIV ANTIBODY (ROUTINE TESTING W REFLEX): HIV Screen 4th Generation wRfx: NONREACTIVE

## 2022-05-19 MED ORDER — GEMFIBROZIL 600 MG PO TABS
600.0000 mg | ORAL_TABLET | Freq: Every day | ORAL | Status: DC
  Administered 2022-05-20 – 2022-05-21 (×2): 600 mg via ORAL
  Filled 2022-05-19 (×3): qty 1

## 2022-05-19 MED ORDER — ACETAMINOPHEN 325 MG PO TABS
650.0000 mg | ORAL_TABLET | Freq: Four times a day (QID) | ORAL | Status: DC | PRN
  Administered 2022-05-20 (×2): 650 mg via ORAL
  Filled 2022-05-19 (×2): qty 2

## 2022-05-19 MED ORDER — IOHEXOL 350 MG/ML SOLN
100.0000 mL | Freq: Once | INTRAVENOUS | Status: AC | PRN
  Administered 2022-05-19: 100 mL via INTRAVENOUS

## 2022-05-19 MED ORDER — ACETAMINOPHEN 650 MG RE SUPP: 650.0000 mg | Freq: Four times a day (QID) | RECTAL | Status: DC | PRN

## 2022-05-19 MED ORDER — ENOXAPARIN SODIUM 40 MG/0.4ML IJ SOSY
40.0000 mg | PREFILLED_SYRINGE | INTRAMUSCULAR | Status: DC
  Administered 2022-05-19 – 2022-05-21 (×3): 40 mg via SUBCUTANEOUS
  Filled 2022-05-19 (×3): qty 0.4

## 2022-05-19 MED ORDER — LACTATED RINGERS IV SOLN: INTRAVENOUS | Status: DC

## 2022-05-19 MED ORDER — SODIUM CHLORIDE 0.9% FLUSH
3.0000 mL | Freq: Two times a day (BID) | INTRAVENOUS | Status: DC
  Administered 2022-05-19 – 2022-05-21 (×2): 3 mL via INTRAVENOUS

## 2022-05-19 MED ORDER — LACTATED RINGERS IV BOLUS
1000.0000 mL | Freq: Once | INTRAVENOUS | Status: AC
  Administered 2022-05-19: 1000 mL via INTRAVENOUS

## 2022-05-19 MED ORDER — POTASSIUM CHLORIDE 10 MEQ/100ML IV SOLN
10.0000 meq | INTRAVENOUS | Status: AC
  Administered 2022-05-19 (×3): 10 meq via INTRAVENOUS
  Filled 2022-05-19 (×3): qty 100

## 2022-05-19 MED ORDER — ASPIRIN 81 MG PO TBEC
81.0000 mg | DELAYED_RELEASE_TABLET | Freq: Every day | ORAL | Status: DC
  Administered 2022-05-19 – 2022-05-21 (×3): 81 mg via ORAL
  Filled 2022-05-19 (×3): qty 1

## 2022-05-19 MED ORDER — ATENOLOL 50 MG PO TABS
100.0000 mg | ORAL_TABLET | Freq: Every day | ORAL | Status: DC
  Administered 2022-05-19 – 2022-05-21 (×3): 100 mg via ORAL
  Filled 2022-05-19 (×3): qty 2

## 2022-05-19 MED ORDER — ONDANSETRON HCL 4 MG/2ML IJ SOLN: 4.0000 mg | Freq: Four times a day (QID) | INTRAMUSCULAR | Status: DC | PRN

## 2022-05-19 MED ORDER — ALBUTEROL SULFATE (2.5 MG/3ML) 0.083% IN NEBU: 2.5000 mg | INHALATION_SOLUTION | Freq: Four times a day (QID) | RESPIRATORY_TRACT | Status: DC | PRN

## 2022-05-19 MED ORDER — CEFTRIAXONE SODIUM 2 G IJ SOLR
2.0000 g | INTRAMUSCULAR | Status: DC
Start: 2022-05-20 — End: 2022-05-21
  Administered 2022-05-20 – 2022-05-21 (×2): 2 g via INTRAVENOUS
  Filled 2022-05-19 (×2): qty 20

## 2022-05-19 MED ORDER — ONDANSETRON HCL 4 MG PO TABS: 4.0000 mg | ORAL_TABLET | Freq: Four times a day (QID) | ORAL | Status: DC | PRN

## 2022-05-19 MED ORDER — OXYCODONE HCL 5 MG PO TABS
5.0000 mg | ORAL_TABLET | Freq: Four times a day (QID) | ORAL | Status: DC | PRN
  Administered 2022-05-19: 5 mg via ORAL
  Filled 2022-05-19: qty 1

## 2022-05-19 MED ORDER — SODIUM CHLORIDE 0.9 % IV SOLN
2.0000 g | Freq: Once | INTRAVENOUS | Status: AC
  Administered 2022-05-19: 2 g via INTRAVENOUS
  Filled 2022-05-19: qty 20
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
## 2022-05-20 DIAGNOSIS — A419 Sepsis, unspecified organism: Secondary | ICD-10-CM | POA: Diagnosis not present

## 2022-05-20 DIAGNOSIS — N390 Urinary tract infection, site not specified: Secondary | ICD-10-CM | POA: Diagnosis not present

## 2022-05-20 LAB — CBC
HCT: 34.6 % — ABNORMAL LOW (ref 39.0–52.0)
Hemoglobin: 11.8 g/dL — ABNORMAL LOW (ref 13.0–17.0)
MCH: 30.9 pg (ref 26.0–34.0)
MCHC: 34.1 g/dL (ref 30.0–36.0)
MCV: 90.6 fL (ref 80.0–100.0)
Platelets: 250 10*3/uL (ref 150–400)
RBC: 3.82 MIL/uL — ABNORMAL LOW (ref 4.22–5.81)
RDW: 13.2 % (ref 11.5–15.5)
WBC: 11.5 10*3/uL — ABNORMAL HIGH (ref 4.0–10.5)
nRBC: 0 % (ref 0.0–0.2)

## 2022-05-20 LAB — BASIC METABOLIC PANEL
Anion gap: 9 (ref 5–15)
BUN: 16 mg/dL (ref 6–20)
CO2: 25 mmol/L (ref 22–32)
Calcium: 8.8 mg/dL — ABNORMAL LOW (ref 8.9–10.3)
Chloride: 103 mmol/L (ref 98–111)
Creatinine, Ser: 0.92 mg/dL (ref 0.61–1.24)
GFR, Estimated: >60 mL/min (ref >60)
Glucose, Bld: 102 mg/dL — ABNORMAL HIGH (ref 70–99)
Potassium: 3.5 mmol/L (ref 3.5–5.1)
Sodium: 137 mmol/L (ref 135–145)

## 2022-05-20 MED ORDER — DOCUSATE SODIUM 100 MG PO CAPS
100.0000 mg | ORAL_CAPSULE | Freq: Two times a day (BID) | ORAL | Status: DC
  Administered 2022-05-20 – 2022-05-21 (×2): 100 mg via ORAL
  Filled 2022-05-20 (×2): qty 1

## 2022-05-20 MED ORDER — ENSURE ENLIVE PO LIQD
237.0000 mL | Freq: Two times a day (BID) | ORAL | Status: DC
  Administered 2022-05-20 – 2022-05-21 (×2): 237 mL via ORAL

## 2022-05-20 MED ORDER — ORAL CARE MOUTH RINSE: 15.0000 mL | OROMUCOSAL | Status: DC | PRN
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
## 2022-05-20 NOTE — Plan of Care (Signed)
  Problem: Nutrition: Goal: Adequate nutrition will be maintained Outcome: Progressing   Problem: Pain Managment: Goal: General experience of comfort will improve Outcome: Progressing   

## 2022-05-21 DIAGNOSIS — A419 Sepsis, unspecified organism: Secondary | ICD-10-CM | POA: Diagnosis not present

## 2022-05-21 DIAGNOSIS — N390 Urinary tract infection, site not specified: Secondary | ICD-10-CM | POA: Diagnosis not present

## 2022-05-21 LAB — URINE CULTURE: Culture: >=100000 — AB

## 2022-05-21 MED ORDER — CEFADROXIL 1 G PO TABS: 1.0000 g | ORAL_TABLET | Freq: Two times a day (BID) | ORAL | 0 refills | Status: AC

## 2022-05-21 MED ORDER — HYDROCHLOROTHIAZIDE 25 MG PO TABS
25.0000 mg | ORAL_TABLET | Freq: Every day | ORAL | Status: DC
  Administered 2022-05-21: 25 mg via ORAL
  Filled 2022-05-21: qty 1
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
## 2022-05-21 NOTE — Progress Notes (Signed)
TR[facility] floor coverage for both [facility] and [facility] (remote) on night of 05/20/22 into morning of 05/21/22:    I was notified by RN with the patient's request for a stool softener, noting that his most recent bowel movement occurred on Wednesday, 05/18/2022.  I subsequently placed order for scheduled Colace.     Babs Bertin, DO Hospitalist

## 2022-05-24 LAB — CULTURE, BLOOD (ROUTINE X 2)
Culture: NO GROWTH
Special Requests: ADEQUATE

## 2022-11-10 ENCOUNTER — Telehealth: Payer: Self-pay

## 2022-11-10 ENCOUNTER — Other Ambulatory Visit (HOSPITAL_COMMUNITY): Payer: Self-pay

## 2022-11-10 ENCOUNTER — Ambulatory Visit: Payer: BC Managed Care – PPO | Admitting: Family Medicine

## 2022-11-10 ENCOUNTER — Encounter: Payer: Self-pay | Admitting: Family Medicine

## 2022-11-10 VITALS — BP 130/84 | HR 75 | Temp 97.6°F | Ht 69.5 in | Wt 290.8 lb

## 2022-11-10 DIAGNOSIS — N529 Male erectile dysfunction, unspecified: Secondary | ICD-10-CM

## 2022-11-10 DIAGNOSIS — Z125 Encounter for screening for malignant neoplasm of prostate: Secondary | ICD-10-CM | POA: Diagnosis not present

## 2022-11-10 DIAGNOSIS — E781 Pure hyperglyceridemia: Secondary | ICD-10-CM | POA: Diagnosis not present

## 2022-11-10 LAB — PSA: PSA: 2.35 ng/mL (ref 0.10–4.00)

## 2022-11-10 MED ORDER — TADALAFIL 5 MG PO TABS: 5.0000 mg | ORAL_TABLET | Freq: Every day | ORAL | 2 refills | Status: AC | PRN

## 2022-11-10 MED ORDER — ATENOLOL 100 MG PO TABS: 100.0000 mg | ORAL_TABLET | Freq: Every day | ORAL | 0 refills | Status: DC

## 2022-11-10 MED ORDER — HYDROCHLOROTHIAZIDE 25 MG PO TABS: 25.0000 mg | ORAL_TABLET | Freq: Every day | ORAL | 0 refills | Status: DC

## 2022-11-10 NOTE — Progress Notes (Signed)
Assessment/Plan:   Problem List Items Addressed This Visit       Other   Obesity, Class III, BMI 40-49.9 (morbid obesity) (HCC)   Relevant Medications   atenolol (TENORMIN) 100 MG tablet   hydrochlorothiazide (HYDRODIURIL) 25 MG tablet   Other Visit Diagnoses     Screening for prostate cancer    -  Primary   Relevant Orders   PSA (Completed)   Hypertriglyceridemia       Relevant Medications   atenolol (TENORMIN) 100 MG tablet   hydrochlorothiazide (HYDRODIURIL) 25 MG tablet   tadalafil (CIALIS) 5 MG tablet   Erectile dysfunction, unspecified erectile dysfunction type       Relevant Medications   tadalafil (CIALIS) 5 MG tablet          Subjective:  HPI:  BRIER REID is a 56 y.o. male who has Fracture of ankle, trimalleolar, closed; Dysuria; Gout; Hyperlipidemia; Hypertension; Microscopic hematuria; Obesity; Prostatitis; Numbness; Balance disorder; Lumbosacral radiculopathy; Sepsis secondary to UTI ([place]); Hypokalemia; Hyponatremia; Obesity, Class III, BMI 40-49.9 (morbid obesity) ([place]); and Humerus fracture on their problem list..   He  has a past medical history of Hypercholesteremia and Hypertension.Marland Kitchen   He presents with chief complaint of Establish Care (Patient's urologist would like PSA done. ) .    Hypertension, established problem, Stable BP Readings from Last 3 Encounters:  11/10/22 130/84  05/21/22 (!) 146/76  05/04/22 130/71    Current Medications: Atenolol and hydrochlorothiazide, compliant without side effects.  ROS: Denies any chest pain, shortness of breath, dyspnea on exertion, leg edema.   Obesity.  Patient has history of weight gain over the past few months due to inactivity associated with arm fracture.  Prior to this patient needs to run ultramarathon.  Patient has started intermittent fasting and lost nearly 20 pounds over the past few weeks.  Elevated PSA and has history of prostatitis and pyelonephritis.  Patient has history of elevated  PSA with history of prostatitis.  He has followed with urology in the past.  His urologist has asked him to get rechecked and send him the results.  Patient has no GU symptoms at the moment. Concern of obesity and weight loss, wants to lose   Hypertriglyceridemia.  Patient is history of mildly elevated currently taking gemfibrozil.  Trial off of meds   Past Surgical History:  Procedure Laterality Date   CHOLECYSTECTOMY     left arm surgery Left    ORIF ANKLE FRACTURE  10/15/2011   Procedure: OPEN REDUCTION INTERNAL FIXATION (ORIF) ANKLE FRACTURE;  Surgeon: Meredith Pel;  Location: [place];  Service: Orthopedics;  Laterality: Right;   ROTATOR CUFF REPAIR Left 05/09/2022    Outpatient Medications Prior to Visit  Medication Sig Dispense Refill   Ascorbic Acid (VITAMIN C) 500 MG CAPS Take 1 capsule by mouth daily.     aspirin EC 81 MG tablet Take 81 mg by mouth daily.       Cholecalciferol (VITAMIN D3 PO) Take 5,000 Units by mouth daily.     Multiple Vitamin (MULTIVITAMIN ADULT PO) Take by mouth.     naproxen sodium (ALEVE) 220 MG tablet Take 440 mg by mouth 2 (two) times daily as needed (for pain).     atenolol (TENORMIN) 100 MG tablet Take 100 mg by mouth daily.       gemfibrozil (LOPID) 600 MG tablet Take 600 mg by mouth daily. Patient reports he takes half a tablet a day     hydrochlorothiazide (HYDRODIURIL) 25  MG tablet Take 25 mg by mouth daily.       tadalafil (CIALIS) 5 MG tablet Take 5-20 mg by mouth daily as needed for erectile dysfunction.     oxyCODONE (OXY IR/ROXICODONE) 5 MG immediate release tablet Take 5-10 mg by mouth every 6 (six) hours as needed for pain. (Patient not taking: Reported on 11/10/2022)     No facility-administered medications prior to visit.    Family History  Problem Relation Age of Onset   Colon cancer Neg Hx    Colon polyps Neg Hx    Esophageal cancer Neg Hx    Stomach cancer Neg Hx    Rectal cancer Neg Hx     Social History   Socioeconomic  History   Marital status: Married    Spouse name: Judson Roch   Number of children: 2   Years of education: Not on file   Highest education level: Not on file  Occupational History   Not on file  Tobacco Use   Smoking status: Never    Passive exposure: Never   Smokeless tobacco: Former    Types: Nurse, children's Use: Never used  Substance and Sexual Activity   Alcohol use: Yes    Comment: occ   Drug use: No   Sexual activity: Not on file  Other Topics Concern   Not on file  Social History Narrative   Lives with wife   Caffeine use: 2-3 cups per day   Right handed   Social Determinants of Health   Financial Resource Strain: Not on file  Food Insecurity: Not on file  Transportation Needs: Not on file  Physical Activity: Not on file  Stress: Not on file  Social Connections: Not on file  Intimate Partner Violence: Not on file                                                                                                 Objective:  Physical Exam: BP 130/84 (BP Location: Right Arm, Patient Position: Sitting, Cuff Size: Large)   Pulse 75   Temp 97.6 F (36.4 C) (Temporal)   Ht 5' 9.5" (1.765 m)   Wt 290 lb 12.8 oz (131.9 kg)   SpO2 98%   BMI 42.33 kg/m    General: No acute distress. Awake and conversant.  Eyes: Normal conjunctiva, anicteric. Round symmetric pupils.  ENT: Hearing grossly intact. No nasal discharge.  Neck: Neck is supple. No masses or thyromegaly.  Respiratory: Respirations are non-labored. No auditory wheezing.  Skin: Warm. No rashes or ulcers.  Psych: Alert and oriented. Cooperative, Appropriate mood and affect, Normal judgment.  CV: No cyanosis or JVD MSK: Normal ambulation. No clubbing  Neuro: Sensation and CN II-XII grossly normal.        Alesia Banda, MD, MS

## 2022-11-10 NOTE — Telephone Encounter (Signed)
Pharmacy Patient Advocate Encounter   Received notification from [facility] that prior authorization for Tadalafil '5mg'$   is required/requested.    PA submitted on 11/10/22 to [place] via CoverMyMeds  Key [id number]   Status is pending

## 2022-11-10 NOTE — Telephone Encounter (Signed)
Pharmacy Patient Advocate Encounter  Received notification from [facility] that the request for prior authorization for Tadalafil 5 mg has been denied due to .

## 2022-11-10 NOTE — Patient Instructions (Signed)
Continue with weight loss Monitor blood pressure at home

## 2022-11-11 NOTE — Telephone Encounter (Signed)
Spoke with patient and he stated he was able to pick up the original rx.

## 2023-02-03 ENCOUNTER — Other Ambulatory Visit: Payer: Self-pay | Admitting: Family Medicine

## 2023-02-03 NOTE — Telephone Encounter (Signed)
Chart supports rx. Last OV: 11/10/2022

## 2023-02-04 ENCOUNTER — Other Ambulatory Visit: Payer: Self-pay | Admitting: Family Medicine

## 2023-04-30 ENCOUNTER — Other Ambulatory Visit: Payer: Self-pay | Admitting: Family Medicine

## 2023-05-01 ENCOUNTER — Other Ambulatory Visit: Payer: Self-pay | Admitting: Family Medicine

## 2023-05-02 ENCOUNTER — Telehealth: Payer: Self-pay | Admitting: Family Medicine

## 2023-05-02 MED ORDER — HYDROCHLOROTHIAZIDE 25 MG PO TABS: 25.0000 mg | ORAL_TABLET | Freq: Every day | ORAL | 0 refills | Status: DC

## 2023-05-02 MED ORDER — ATENOLOL 100 MG PO TABS: 100.0000 mg | ORAL_TABLET | Freq: Every day | ORAL | 0 refills | Status: DC

## 2023-05-02 NOTE — Telephone Encounter (Signed)
Prescription Request  05/02/2023  LOV: 11/10/2022  What is the name of the medication or equipment? hydrochlorothiazide (HYDRODIURIL) 25 MG tablet [[id number]] and atenolol (TENORMIN) 100 MG tablet [[id number]]   Have you contacted your pharmacy to request a refill? Yes, he has an OV scheduled on 05/18/23 with Dr Janee Morn Which pharmacy would you like this sent to?  [facility] [address] [place], [place] - [address] [place] [address] Phone: [phone] Fax: [phone]     Patient notified that their request is being sent to the clinical staff for review and that they should receive a response within 2 business days.   Please advise at [facility] [phone]

## 2023-05-02 NOTE — Telephone Encounter (Signed)
Chart supports rx. Last OV: 11/10/2022  Next OV: 05/18/2023

## 2023-05-18 ENCOUNTER — Ambulatory Visit: Payer: BC Managed Care – PPO | Admitting: Family Medicine

## 2023-05-18 ENCOUNTER — Encounter: Payer: Self-pay | Admitting: Family Medicine

## 2023-05-18 VITALS — BP 130/80 | HR 79 | Temp 98.2°F | Wt 291.4 lb

## 2023-05-18 DIAGNOSIS — M10072 Idiopathic gout, left ankle and foot: Secondary | ICD-10-CM

## 2023-05-18 DIAGNOSIS — I10 Essential (primary) hypertension: Secondary | ICD-10-CM

## 2023-05-18 MED ORDER — HYDROCHLOROTHIAZIDE 25 MG PO TABS: 25.0000 mg | ORAL_TABLET | Freq: Every day | ORAL | 0 refills | Status: DC

## 2023-05-18 MED ORDER — ATENOLOL 100 MG PO TABS: 100.0000 mg | ORAL_TABLET | Freq: Every day | ORAL | 0 refills | Status: DC

## 2023-05-18 MED ORDER — COLCHICINE 0.6 MG PO CAPS: ORAL_CAPSULE | ORAL | 0 refills | Status: DC

## 2023-05-18 NOTE — Patient Instructions (Signed)
Continue following a keto diet and maintain low sugar and low carb intake to manage weight and blood pressure. Start taking colchicine as prescribed: take two tablets at the onset of a gout flare and one additional tablet one hour later if necessary. Then, take one tablet daily until the symptoms subside. Stay hydrated by drinking plenty of water throughout the day, aiming for consistent fluid intake to help prevent gout flares. Consider moderate intensity exercises such as swimming or brisk walking that are not high-impact to continue with weight loss efforts without aggravating joints.

## 2023-05-18 NOTE — Progress Notes (Signed)
Assessment/Plan:   Problem List Items Addressed This Visit       Cardiovascular and Mediastinum   Hypertension   Relevant Medications   atenolol (TENORMIN) 100 MG tablet   hydrochlorothiazide (HYDRODIURIL) 25 MG tablet     Other   Gout - Primary    Recurrent flares affecting patient's mobility and weight management.  Plan: Prescribe Colchicine: Start with two tablets at the onset of flare, then one tablet an hour later Encourage continued use of plenty of water. Plan for uric acid level check and comprehensive lab work in two months. Discuss dietary adjustments with patient; refer to a dietitian if needed for weight management.      Relevant Medications   Colchicine 0.6 MG CAPS   Other Relevant Orders   Vitamin D 1,25 dihydroxy   Uric acid   Obesity, Class III, BMI 40-49.9 (morbid obesity) (HCC)   Relevant Medications   Colchicine 0.6 MG CAPS   Other Relevant Orders   TSH   Lipid panel   Hemoglobin A1c   Microalbumin / creatinine urine ratio   Urinalysis, Routine w reflex microscopic   CBC with Differential/Platelet   Comprehensive metabolic panel   Uric acid    Medications Discontinued During This Encounter  Medication Reason   atenolol (TENORMIN) 100 MG tablet Reorder   hydrochlorothiazide (HYDRODIURIL) 25 MG tablet Reorder    Return in about 2 months (around 07/18/2023) for BP, fasting labs 1 week before.    Subjective:   Encounter date: 05/18/2023  Micheal Chandler is a 57 y.o. male who has Fracture of ankle, trimalleolar, closed; Dysuria; Gout; Hyperlipidemia; Hypertension; Microscopic hematuria; Obesity; Prostatitis; Numbness; Balance disorder; Lumbosacral radiculopathy; Hypokalemia; Hyponatremia; Obesity, Class III, BMI 40-49.9 (morbid obesity) (HCC); and Humerus fracture on their problem list..   He  has a past medical history of Hypercholesteremia, Hypertension, and Sepsis secondary to UTI (HCC) (05/19/2022)..   Chief Complaint: Follow-up on blood  pressure and concerns about recurrent gout flares.  History of Present Illness:  Gout. The patient notes that after losing about 18 lbs, his left ankle began hurting, indicating a gout flare-up lasting the entire month of May. More recently, last Thursday, he experienced another flare in his left big toe despite maintaining a keto diet, low in sugar and carbs. Diet includes minimal red meat, poultry, little to no pork, and some seafood. Patient mentioned that dietary changes have not contributed to recent flare-ups. He emphasizes the impact on his mobility and weight management strategies, stating that he is unable to stay active due to recurrent flares. He is not using daily gout medication but is open to abortive therapy. No NSAIDs except an occasional Aleve, which he has taken during severe pain. He has been drinking lots of water to assist with gout flair as well.   Review of Systems  Constitutional:  Negative for chills, diaphoresis, fever and malaise/fatigue.  HENT:  Negative for congestion, ear discharge, ear pain and hearing loss.   Eyes:  Negative for blurred vision, double vision, photophobia, pain, discharge and redness.  Respiratory:  Negative for cough, sputum production, shortness of breath and wheezing.   Cardiovascular:  Negative for chest pain and palpitations.  Gastrointestinal:  Negative for abdominal pain, blood in stool, constipation, diarrhea, heartburn, melena, nausea and vomiting.  Genitourinary:  Negative for dysuria, flank pain, frequency, hematuria and urgency.  Musculoskeletal:  Positive for joint pain (toe pain). Negative for myalgias.  Skin:  Negative for itching and rash.  Neurological:  Negative for  dizziness, tingling, tremors, speech change, seizures, loss of consciousness, weakness and headaches.  Endo/Heme/Allergies:  Negative for polydipsia.  Psychiatric/Behavioral:  Negative for depression, hallucinations, memory loss, substance abuse and suicidal ideas. The  patient does not have insomnia.   All other systems reviewed and are negative.   Past Surgical History:  Procedure Laterality Date   CHOLECYSTECTOMY     left arm surgery Left    ORIF ANKLE FRACTURE  10/15/2011   Procedure: OPEN REDUCTION INTERNAL FIXATION (ORIF) ANKLE FRACTURE;  Surgeon: Cammy Copa;  Location: [facility] OR;  Service: Orthopedics;  Laterality: Right;   ROTATOR CUFF REPAIR Left 05/09/2022    Outpatient Medications Prior to Visit  Medication Sig Dispense Refill   Ascorbic Acid (VITAMIN C) 500 MG CAPS Take 1 capsule by mouth daily.     aspirin EC 81 MG tablet Take 81 mg by mouth daily.       Cholecalciferol (VITAMIN D3 PO) Take 5,000 Units by mouth daily.     Multiple Vitamin (MULTIVITAMIN ADULT PO) Take by mouth.     naproxen sodium (ALEVE) 220 MG tablet Take 440 mg by mouth 2 (two) times daily as needed (for pain).     tadalafil (CIALIS) 5 MG tablet Take 1-4 tablets (5-20 mg total) by mouth daily as needed for erectile dysfunction. 10 tablet 2   atenolol (TENORMIN) 100 MG tablet Take 1 tablet (100 mg total) by mouth daily. 30 tablet 0   hydrochlorothiazide (HYDRODIURIL) 25 MG tablet Take 1 tablet (25 mg total) by mouth daily. 30 tablet 0   No facility-administered medications prior to visit.    Family History  Problem Relation Age of Onset   Colon cancer Neg Hx    Colon polyps Neg Hx    Esophageal cancer Neg Hx    Stomach cancer Neg Hx    Rectal cancer Neg Hx     Social History   Socioeconomic History   Marital status: Married    Spouse name: Maralyn Sago   Number of children: 2   Years of education: Not on file   Highest education level: Not on file  Occupational History   Not on file  Tobacco Use   Smoking status: Never    Passive exposure: Never   Smokeless tobacco: Former    Types: Associate Professor Use: Never used  Substance and Sexual Activity   Alcohol use: Yes    Comment: occ   Drug use: No   Sexual activity: Not on file  Other Topics  Concern   Not on file  Social History Narrative   Lives with wife   Caffeine use: 2-3 cups per day   Right handed   Social Determinants of Health   Financial Resource Strain: Not on file  Food Insecurity: Not on file  Transportation Needs: Not on file  Physical Activity: Not on file  Stress: Not on file  Social Connections: Not on file  Intimate Partner Violence: Not on file  Objective:  Physical Exam: BP 130/80 (BP Location: Left Arm, Patient Position: Sitting, Cuff Size: Large)   Pulse 79   Temp 98.2 F (36.8 C) (Temporal)   Wt 291 lb 6.4 oz (132.2 kg)   SpO2 97%   BMI 42.42 kg/m   Wt Readings from Last 3 Encounters:  05/18/23 291 lb 6.4 oz (132.2 kg)  11/10/22 290 lb 12.8 oz (131.9 kg)  05/19/22 290 lb 9.1 oz (131.8 kg)     Physical Exam Constitutional:      Appearance: Normal appearance.  HENT:     Head: Normocephalic and atraumatic.     Right Ear: Hearing normal.     Left Ear: Hearing normal.     Nose: Nose normal.  Eyes:     General: No scleral icterus.       Right eye: No discharge.        Left eye: No discharge.     Extraocular Movements: Extraocular movements intact.  Cardiovascular:     Rate and Rhythm: Normal rate and regular rhythm.     Heart sounds: Normal heart sounds.  Pulmonary:     Effort: Pulmonary effort is normal.     Breath sounds: Normal breath sounds.  Abdominal:     Palpations: Abdomen is soft.     Tenderness: There is no abdominal tenderness.  Musculoskeletal:     Left foot: Swelling (Redness in left big toe) and tenderness present.  Skin:    General: Skin is warm.     Findings: No rash.  Neurological:     General: No focal deficit present.     Mental Status: He is alert.     Cranial Nerves: No cranial nerve deficit.  Psychiatric:        Mood and Affect: Mood normal.        Behavior: Behavior normal.        Thought Content:  Thought content normal.        Judgment: Judgment normal.     No results found.  No results found for this or any previous visit (from the past 2160 hour(s)).      Garner Nash, MD, MS

## 2023-05-19 NOTE — Assessment & Plan Note (Signed)
Recurrent flares affecting patient's mobility and weight management.  Plan: Prescribe Colchicine: Start with two tablets at the onset of flare, then one tablet an hour later Encourage continued use of plenty of water. Plan for uric acid level check and comprehensive lab work in two months. Discuss dietary adjustments with patient; refer to a dietitian if needed for weight management.

## 2023-06-20 ENCOUNTER — Encounter: Payer: Self-pay | Admitting: Internal Medicine

## 2023-06-20 ENCOUNTER — Ambulatory Visit: Payer: BC Managed Care – PPO | Admitting: Internal Medicine

## 2023-06-20 VITALS — BP 130/82 | HR 57 | Temp 98.2°F | Ht 69.5 in | Wt 289.4 lb

## 2023-06-20 DIAGNOSIS — R208 Other disturbances of skin sensation: Secondary | ICD-10-CM | POA: Diagnosis not present

## 2023-06-20 NOTE — Progress Notes (Signed)
[facility] [address] [place] [address] Dept: [phone] Dept Fax: [phone]  Acute Care Office Visit  Subjective:   Micheal Chandler 1966/11/21 06/20/2023  Chief Complaint  Patient presents with   Extremity Weakness    Right leg feels numb after started taking gout medication     HPI: Discussed the use of AI scribe software for clinical note transcription with the patient, who gave verbal consent to proceed.  History of Present Illness   The patient, with a history of gout, presents with a sensation of "fuzziness and clogging" in the right foot and leg. The symptoms started after a gout flare-up 1 month ago, which was successfully treated with colchicine. The patient reports that the sensation seems to be spreading up the leg to his Right knee. He is concerned of nerve damage. The patient notes a decrease in sensitivity to touch in the right leg compared to the left. Despite these symptoms, the patient continues to engage in regular exercise, including walking at least 5K a day and daily workouts. No motor weakness. Denies bowel/bladder incontinence, recent back injury or lifting heavy weights, neck or back pain.  He does have hx of Lumbosacral radiculopathy, which is managed with chiropractic care.      The following portions of the patient's history were reviewed and updated as appropriate: past medical history, past surgical history, family history, social history, allergies, medications, and problem list.   Patient Active Problem List   Diagnosis Date Noted   Hypokalemia 05/19/2022   Hyponatremia 05/19/2022   Obesity, Class III, BMI 40-49.9 (morbid obesity) (HCC) 05/19/2022   Humerus fracture 05/19/2022   Lumbosacral radiculopathy 02/18/2020   Numbness 12/03/2019   Balance disorder 12/03/2019   Gout 08/13/2019   Dysuria 12/09/2016   Microscopic hematuria 12/09/2016   Prostatitis 12/09/2016   Obesity  06/10/2014   Fracture of ankle, trimalleolar, closed 10/15/2011   Hyperlipidemia 09/29/2011   Hypertension 09/29/2011   Past Medical History:  Diagnosis Date   Hypercholesteremia    Hypertension    Sepsis secondary to UTI (HCC) 05/19/2022   Past Surgical History:  Procedure Laterality Date   CHOLECYSTECTOMY     left arm surgery Left    ORIF ANKLE FRACTURE  10/15/2011   Procedure: OPEN REDUCTION INTERNAL FIXATION (ORIF) ANKLE FRACTURE;  Surgeon: Cammy Copa;  Location: [facility] OR;  Service: Orthopedics;  Laterality: Right;   ROTATOR CUFF REPAIR Left 05/09/2022   Family History  Problem Relation Age of Onset   Colon cancer Neg Hx    Colon polyps Neg Hx    Esophageal cancer Neg Hx    Stomach cancer Neg Hx    Rectal cancer Neg Hx    Outpatient Medications Prior to Visit  Medication Sig Dispense Refill   Ascorbic Acid (VITAMIN C) 500 MG CAPS Take 1 capsule by mouth daily.     aspirin EC 81 MG tablet Take 81 mg by mouth daily.       atenolol (TENORMIN) 100 MG tablet Take 1 tablet (100 mg total) by mouth daily. 90 tablet 0   hydrochlorothiazide (HYDRODIURIL) 25 MG tablet Take 1 tablet (25 mg total) by mouth daily. 90 tablet 0   naproxen sodium (ALEVE) 220 MG tablet Take 440 mg by mouth 2 (two) times daily as needed (for pain).     tadalafil (CIALIS) 5 MG tablet Take 1-4 tablets (5-20 mg total) by mouth daily as needed for erectile dysfunction. 10 tablet 2   Cholecalciferol (  VITAMIN D3 PO) Take 5,000 Units by mouth daily. (Patient not taking: Reported on 06/20/2023)     Colchicine 0.6 MG CAPS Day 1: Take 2 caps, then 1 cap an hour later. Day 2 and beyond: 1 cap daily (Patient not taking: Reported on 06/20/2023) 30 capsule 0   Multiple Vitamin (MULTIVITAMIN ADULT PO) Take by mouth. (Patient not taking: Reported on 06/20/2023)     No facility-administered medications prior to visit.   Allergies  Allergen Reactions   Warfarin Sodium Other (See Comments)    Patient states he had  difficulty breathing; pt stated, "It does not make me feel good"   Celebrex [Celecoxib] Other (See Comments)    Pt unsure, feels like he has had shortness of breath with taking   Hydrocodone     Patient states that it does not work for him     ROS: A complete ROS was performed with pertinent positives/negatives noted in the HPI. The remainder of the ROS are negative.    Objective:   Today's Vitals   06/20/23 1007  BP: 130/82  Pulse: (!) 57  Temp: 98.2 F (36.8 C)  TempSrc: Temporal  SpO2: 97%  Weight: 289 lb 6.4 oz (131.3 kg)  Height: 5' 9.5" (1.765 m)    GENERAL: Well-appearing, in NAD. Well nourished.  SKIN: Pink, warm and dry. No rash, lesion, ulceration, or ecchymoses.  NECK: Trachea midline. Full ROM w/o pain or tenderness. No lymphadenopathy.  RESPIRATORY: Chest wall symmetrical. Respirations even and non-labored. Breath sounds clear to auscultation bilaterally.  CARDIAC: S1, S2 present, regular rate and rhythm. Peripheral pulses 2+ bilaterally.  MSK: Muscle tone and strength appropriate for age. Joints w/o tenderness, redness, or swelling. EXTREMITIES: Without clubbing, cyanosis, or edema.  NEUROLOGIC: CN 2-12 intact. DTR 2+ bilaterally. 5/5 upper and lower extremity strength. No motor weakness. 4/5 decreased sensation to RLE. Able to distinguish between sharp and light touch.  Steady, even gait.  PSYCH/MENTAL STATUS: Alert, oriented x 3. Cooperative, appropriate mood and affect.      No results found for any visits on 06/20/23.    Assessment & Plan:  Assessment and Plan    Unexplained right lower extremity paresthesia: Gradual onset of decreased sensation in right lower extremity following resolution of gout flare. No associated pain, weakness, or bowel/bladder incontinence. No back pain. No similar symptoms in upper extremities. - Order lumbar and cervical spine X-rays to evaluate for possible nerve impingement. - Depending on X-ray results, consider referral to  orthopedics for MRI or neurology for nerve conduction study.   No orders of the defined types were placed in this encounter.  Lab Orders  No laboratory test(s) ordered today   No images are attached to the encounter or orders placed in the encounter.  Return if symptoms worsen or fail to improve.   Of note, portions of this note may have been created with voice recognition software [employer]ic[employer], medical). While this note has been edited for accuracy, occasional wrong-word or 'sound-a-like' substitutions may have occurred due to the inherent limitations of voice recognition software.  Salvatore Decent, FNP

## 2023-06-30 ENCOUNTER — Ambulatory Visit (INDEPENDENT_AMBULATORY_CARE_PROVIDER_SITE_OTHER): Payer: BC Managed Care – PPO

## 2023-06-30 DIAGNOSIS — R208 Other disturbances of skin sensation: Secondary | ICD-10-CM

## 2023-07-30 LAB — LAB REPORT - SCANNED
A1c: 4.9
EGFR: 98

## 2023-07-31 ENCOUNTER — Telehealth: Payer: Self-pay | Admitting: Family Medicine

## 2023-07-31 NOTE — Telephone Encounter (Signed)
Pt think he need a lab appoint and follow up . Please confirm with him that he need both of them

## 2023-08-14 ENCOUNTER — Ambulatory Visit (INDEPENDENT_AMBULATORY_CARE_PROVIDER_SITE_OTHER): Payer: BC Managed Care – PPO | Admitting: Family Medicine

## 2023-08-14 ENCOUNTER — Encounter: Payer: Self-pay | Admitting: Family Medicine

## 2023-08-14 VITALS — BP 132/84 | HR 54 | Temp 97.9°F | Wt 282.2 lb

## 2023-08-14 DIAGNOSIS — I10 Essential (primary) hypertension: Secondary | ICD-10-CM | POA: Diagnosis not present

## 2023-08-14 DIAGNOSIS — Z6841 Body Mass Index (BMI) 40.0 and over, adult: Secondary | ICD-10-CM

## 2023-08-14 DIAGNOSIS — E7800 Pure hypercholesterolemia, unspecified: Secondary | ICD-10-CM

## 2023-08-14 DIAGNOSIS — E66813 Obesity, class 3: Secondary | ICD-10-CM

## 2023-08-14 DIAGNOSIS — M10072 Idiopathic gout, left ankle and foot: Secondary | ICD-10-CM

## 2023-08-14 MED ORDER — ROSUVASTATIN CALCIUM 10 MG PO TABS: 10.0000 mg | ORAL_TABLET | Freq: Every day | ORAL | 3 refills | Status: DC

## 2023-08-14 MED ORDER — ATENOLOL 100 MG PO TABS: 100.0000 mg | ORAL_TABLET | Freq: Every day | ORAL | 3 refills | Status: DC

## 2023-08-14 MED ORDER — HYDROCHLOROTHIAZIDE 25 MG PO TABS: 25.0000 mg | ORAL_TABLET | Freq: Every day | ORAL | 0 refills | Status: DC

## 2023-08-14 NOTE — Assessment & Plan Note (Signed)
Well-controlled with current management.  Plan:  Continue colchicine as needed for gout flares. Monitor uric acid levels as needed.

## 2023-08-14 NOTE — Progress Notes (Signed)
Assessment/Plan:   Problem List Items Addressed This Visit       Cardiovascular and Mediastinum   Hypertension - Primary    Well-controlled.  Continue atenolol 100 mg daily and hydrochlorothiazide daily. Monitor blood pressure at home regularly.      Relevant Medications   rosuvastatin (CRESTOR) 10 MG tablet   hydrochlorothiazide (HYDRODIURIL) 25 MG tablet   atenolol (TENORMIN) 100 MG tablet     Other   Gout    Well-controlled with current management.  Plan:  Continue colchicine as needed for gout flares. Monitor uric acid levels as needed.      Hyperlipidemia    LDL above goal 100 milligrams per deciliter  Start rosuvastatin 10 mg once daily. Follow-up cholesterol test in 90 days. Continue ketogenic diet and weight loss efforts.      Relevant Medications   rosuvastatin (CRESTOR) 10 MG tablet   hydrochlorothiazide (HYDRODIURIL) 25 MG tablet   atenolol (TENORMIN) 100 MG tablet   Obesity    Encouraged continued ketogenic diet and continue monitoring. Patient resistant medication, however would be content for a GLP-1.       Medications Discontinued During This Encounter  Medication Reason   atenolol (TENORMIN) 100 MG tablet Reorder   hydrochlorothiazide (HYDRODIURIL) 25 MG tablet Reorder    Return in about 3 months (around 11/13/2023).    Subjective:   Encounter date: 08/14/2023  Micheal Chandler is a 57 y.o. male who has Fracture of ankle, trimalleolar, closed; Dysuria; Gout; Hyperlipidemia; Hypertension; Microscopic hematuria; Obesity; Prostatitis; Numbness; Balance disorder; Lumbosacral radiculopathy; Hypokalemia; Hyponatremia; Obesity, Class III, BMI 40-49.9 (morbid obesity) (HCC); and Humerus fracture on their problem list..   He  has a past medical history of Hypercholesteremia, Hypertension, and Sepsis secondary to UTI (HCC) (05/19/2022)..   Chief Complaint: Follow-up on blood pressure and gout.  History of Present Illness:  Patient presents  with several labs obtained from outpatient with clinic.  Is reviewing scanned to the chart.  Blood Pressure: The patient is here for a follow-up on his blood pressure management. The patient has been working with a wellness doctor to help with weight loss and other health concerns. He reports that his current medication regimen includes atenolol and hydrochlorothiazide, which appear to be controlling his blood pressure effectively with readings around 132/84 mm Hg.  Recent GFR was within normal limits.    Gout: The patient has a history of gout for which he was previously prescribed colchicine. He reports that the medication was very effective, relieving symptoms within a day or two. His uric acid levels were noted to be 8.2 possibly associated with his high protein keto diet, but otherwise managed without frequent flares.  Cholesterol and Heart Disease Risk: A recent lab panel showed elevated LDL cholesterol levels at 782 mg/dL and elevated LDL particle size 1254. The patient was previously on gemfibrozil for triglycerides, which are now well controlled, but was not on a statin.   Weight management.  Patient is actively attempting to lose weight through ketogenic diet.  He is lost approximately 7 pounds since last visit. s Review of Systems  Constitutional:  Negative for chills, diaphoresis, fever, malaise/fatigue and weight loss.  HENT:  Negative for congestion, ear discharge, ear pain and hearing loss.   Eyes:  Negative for blurred vision, double vision, photophobia, pain, discharge and redness.  Respiratory:  Negative for cough, sputum production, shortness of breath and wheezing.   Cardiovascular:  Negative for chest pain, palpitations and leg swelling.  Gastrointestinal:  Negative  for abdominal pain, blood in stool, constipation, diarrhea, heartburn, melena, nausea and vomiting.  Genitourinary:  Negative for dysuria, flank pain, frequency, hematuria and urgency.  Musculoskeletal:  Negative  for myalgias.  Skin:  Negative for itching and rash.  Neurological:  Negative for dizziness, tingling, tremors, speech change, seizures, loss of consciousness, weakness and headaches.  Endo/Heme/Allergies:  Negative for polydipsia.  Psychiatric/Behavioral:  Negative for depression, hallucinations, memory loss, substance abuse and suicidal ideas. The patient does not have insomnia.   All other systems reviewed and are negative.   Past Surgical History:  Procedure Laterality Date   CHOLECYSTECTOMY     left arm surgery Left    ORIF ANKLE FRACTURE  10/15/2011   Procedure: OPEN REDUCTION INTERNAL FIXATION (ORIF) ANKLE FRACTURE;  Surgeon: Cammy Copa;  Location: [facility] OR;  Service: Orthopedics;  Laterality: Right;   ROTATOR CUFF REPAIR Left 05/09/2022    Outpatient Medications Prior to Visit  Medication Sig Dispense Refill   anastrozole (ARIMIDEX) 1 MG tablet Take 1 mg by mouth once a week.     Ascorbic Acid (VITAMIN C) 500 MG CAPS Take 1 capsule by mouth daily.     aspirin EC 81 MG tablet Take 81 mg by mouth daily.       Cholecalciferol (VITAMIN D3 PO) Take 5,000 Units by mouth daily.     Colchicine 0.6 MG CAPS Day 1: Take 2 caps, then 1 cap an hour later. Day 2 and beyond: 1 cap daily 30 capsule 0   naproxen sodium (ALEVE) 220 MG tablet Take 440 mg by mouth 2 (two) times daily as needed (for pain).     tadalafil (CIALIS) 5 MG tablet Take 1-4 tablets (5-20 mg total) by mouth daily as needed for erectile dysfunction. 10 tablet 2   testosterone cypionate (DEPOTESTOSTERONE CYPIONATE) 200 MG/ML injection Inject into the muscle. 0.25 ml twice a week     Vitamin D-Vitamin K (VITAMIN K2-VITAMIN D3 PO) Take by mouth.     atenolol (TENORMIN) 100 MG tablet Take 1 tablet (100 mg total) by mouth daily. 90 tablet 0   hydrochlorothiazide (HYDRODIURIL) 25 MG tablet Take 1 tablet (25 mg total) by mouth daily. 90 tablet 0   Multiple Vitamin (MULTIVITAMIN ADULT PO) Take by mouth. (Patient not taking:  Reported on 06/20/2023)     No facility-administered medications prior to visit.    Family History  Problem Relation Age of Onset   Colon cancer Neg Hx    Colon polyps Neg Hx    Esophageal cancer Neg Hx    Stomach cancer Neg Hx    Rectal cancer Neg Hx     Social History   Socioeconomic History   Marital status: Married    Spouse name: Maralyn Sago   Number of children: 2   Years of education: Not on file   Highest education level: Not on file  Occupational History   Not on file  Tobacco Use   Smoking status: Never    Passive exposure: Never   Smokeless tobacco: Former    Types: Engineer, drilling   Vaping status: Never Used  Substance and Sexual Activity   Alcohol use: Yes    Comment: occ   Drug use: No   Sexual activity: Not on file  Other Topics Concern   Not on file  Social History Narrative   Lives with wife   Caffeine use: 2-3 cups per day   Right handed   Social Determinants of Health   Financial Resource Strain:  Not on file  Food Insecurity: Not on file  Transportation Needs: Not on file  Physical Activity: Not on file  Stress: Not on file  Social Connections: Not on file  Intimate Partner Violence: Not on file                                                                                                  Objective:  Physical Exam: BP 132/84 (BP Location: Left Arm, Patient Position: Sitting, Cuff Size: Large)   Pulse (!) 54   Temp 97.9 F (36.6 C) (Temporal)   Wt 282 lb 3.2 oz (128 kg)   SpO2 99%   BMI 41.08 kg/m   Wt Readings from Last 3 Encounters:  08/14/23 282 lb 3.2 oz (128 kg)  06/20/23 289 lb 6.4 oz (131.3 kg)  05/18/23 291 lb 6.4 oz (132.2 kg)     Physical Exam Constitutional:      Appearance: Normal appearance.  HENT:     Head: Normocephalic and atraumatic.     Right Ear: Hearing normal.     Left Ear: Hearing normal.     Nose: Nose normal.  Eyes:     General: No scleral icterus.       Right eye: No discharge.        Left eye:  No discharge.     Extraocular Movements: Extraocular movements intact.  Cardiovascular:     Rate and Rhythm: Normal rate and regular rhythm.     Heart sounds: Normal heart sounds.  Pulmonary:     Effort: Pulmonary effort is normal.     Breath sounds: Normal breath sounds.  Abdominal:     Palpations: Abdomen is soft.     Tenderness: There is no abdominal tenderness.  Skin:    General: Skin is warm.     Findings: No rash.  Neurological:     General: No focal deficit present.     Mental Status: He is alert.     Cranial Nerves: No cranial nerve deficit.  Psychiatric:        Mood and Affect: Mood normal.        Behavior: Behavior normal.        Thought Content: Thought content normal.        Judgment: Judgment normal.     DG Cervical Spine Complete  Result Date: 06/30/2023 CLINICAL DATA:  Neck pain. EXAM: CERVICAL SPINE - COMPLETE 4+ VIEW COMPARISON:  None Available. FINDINGS: There is no evidence of cervical spine fracture or prevertebral soft tissue swelling. Alignment is normal. Midline anterior spurring noted at C4, C5, and C6. Intervertebral spaces are normal. IMPRESSION: Mild degenerative joint changes of cervical spine. Electronically Signed   By: Sherian Rein M.D.   On: 06/30/2023 09:31   DG Lumbar Spine 2-3 Views  Result Date: 06/30/2023 CLINICAL DATA:  Decreased sensation to the right lower extremity. EXAM: LUMBAR SPINE - 2-3 VIEW COMPARISON:  None Available. FINDINGS: There is no evidence of lumbar spine fracture. Mild curvature of spine. Minimal narrow intervertebral spaces at L4-5 and L5-S1. Minimal anterior spurring identified throughout lumbar spine. IMPRESSION: Minimal  degenerative joint changes of lumbar spine. Electronically Signed   By: Sherian Rein M.D.   On: 06/30/2023 09:30    No results found for this or any previous visit (from the past 2160 hour(s)).      Garner Nash, MD, MS

## 2023-08-14 NOTE — Assessment & Plan Note (Signed)
Well-controlled.  Continue atenolol 100 mg daily and hydrochlorothiazide daily. Monitor blood pressure at home regularly.

## 2023-08-14 NOTE — Assessment & Plan Note (Signed)
Encouraged continued ketogenic diet and continue monitoring. Patient resistant medication, however would be content for a GLP-1.

## 2023-08-14 NOTE — Assessment & Plan Note (Signed)
LDL above goal 100 milligrams per deciliter  Start rosuvastatin 10 mg once daily. Follow-up cholesterol test in 90 days. Continue ketogenic diet and weight loss efforts.

## 2023-09-23 ENCOUNTER — Ambulatory Visit
Admission: EM | Admit: 2023-09-23 | Discharge: 2023-09-23 | Disposition: A | Discharge status: Home / Self Care | Payer: BC Managed Care – PPO | Attending: Internal Medicine | Admitting: Internal Medicine

## 2023-09-23 DIAGNOSIS — W540XXA Bitten by dog, initial encounter: Secondary | ICD-10-CM | POA: Diagnosis not present

## 2023-09-23 DIAGNOSIS — Z23 Encounter for immunization: Secondary | ICD-10-CM | POA: Diagnosis not present

## 2023-09-23 DIAGNOSIS — S81851A Open bite, right lower leg, initial encounter: Secondary | ICD-10-CM | POA: Diagnosis not present

## 2023-09-23 MED ORDER — RABIES VACCINE, PCEC IM SUSR
1.0000 mL | Freq: Once | INTRAMUSCULAR | Status: AC
  Administered 2023-09-23: 1 mL via INTRAMUSCULAR

## 2023-09-23 MED ORDER — AMOXICILLIN-POT CLAVULANATE 875-125 MG PO TABS: 1.0000 | ORAL_TABLET | Freq: Two times a day (BID) | ORAL | 0 refills | Status: DC

## 2023-09-23 MED ORDER — RABIES IMMUNE GLOBULIN 150 UNIT/ML IM INJ
20.0000 [IU]/kg | INJECTION | Freq: Once | INTRAMUSCULAR | Status: AC
  Administered 2023-09-23: 2475 [IU]

## 2023-09-23 NOTE — Discharge Instructions (Signed)
Keep your wound clean and dry.  Change the dressing daily unless it becomes soiled or saturated.  Start Augmentin twice daily for 7 days.  You are given the first rabies vaccination today in clinic.  Please return to the clinic on day 3, 7, and 14 for additional vaccinations.  Please follow-up with your PCP in 2 to 3 days for recheck.  Please go to the ER if you develop any worsening symptoms.  This includes but is not limited to fevers, chills, redness, swelling, warmth, streaking of the wound or any new concerns that arise.  I hope you feel better soon!

## 2023-09-23 NOTE — ED Notes (Signed)
Per chart, pt last tetanus vaccine was 10/18/2021.

## 2023-09-23 NOTE — ED Triage Notes (Signed)
Pt presents to UC w/ c/o dog bite to right lower leg 1 hour ago. Pt states he was walking in the street and the dog bit him. The pt does not know if the dog is up to date on vaccines. Pt is not aware if he is up to date on tetanus vaccine.

## 2023-09-23 NOTE — ED Provider Notes (Signed)
[facility]    CSN: [id number] Arrival date & time: 09/23/23  1332      History   Chief Complaint No chief complaint on file.   HPI KHALIQ BURGGRAF is a 57 y.o. male presents for evaluation of a dog bite.  Patient states 1 hour PTA he was walking in a park and he was bitten by a stray dog on his right calf.  He did not perform any wound care but did come in for evaluation.  He is up-to-date on his tetanus from 2022.  As the dog is a stray he is unaware of its rabies vaccination status.  No OTC medications have been taken since injury occurred.  No other concerns at this time  HPI  Past Medical History:  Diagnosis Date   Hypercholesteremia    Hypertension    Sepsis secondary to UTI (HCC) 05/19/2022    Patient Active Problem List   Diagnosis Date Noted   Hypokalemia 05/19/2022   Hyponatremia 05/19/2022   Obesity, Class III, BMI 40-49.9 (morbid obesity) (HCC) 05/19/2022   Humerus fracture 05/19/2022   Lumbosacral radiculopathy 02/18/2020   Numbness 12/03/2019   Balance disorder 12/03/2019   Gout 08/13/2019   Dysuria 12/09/2016   Microscopic hematuria 12/09/2016   Prostatitis 12/09/2016   Obesity 06/10/2014   Fracture of ankle, trimalleolar, closed 10/15/2011   Hyperlipidemia 09/29/2011   Hypertension 09/29/2011    Past Surgical History:  Procedure Laterality Date   CHOLECYSTECTOMY     left arm surgery Left    ORIF ANKLE FRACTURE  10/15/2011   Procedure: OPEN REDUCTION INTERNAL FIXATION (ORIF) ANKLE FRACTURE;  Surgeon: Cammy Copa;  Location: [facility] OR;  Service: Orthopedics;  Laterality: Right;   ROTATOR CUFF REPAIR Left 05/09/2022       Home Medications    Prior to Admission medications   Medication Sig Start Date End Date Taking? Authorizing Provider  amoxicillin-clavulanate (AUGMENTIN) 875-125 MG tablet Take 1 tablet by mouth every 12 (twelve) hours. 09/23/23  Yes Radford Pax, NP  anastrozole (ARIMIDEX) 1 MG tablet Take 1 mg by mouth once  a week. 08/03/23   [provider]  Ascorbic Acid (VITAMIN C) 500 MG CAPS Take 1 capsule by mouth daily.    [provider]  aspirin EC 81 MG tablet Take 81 mg by mouth daily.      [provider]  atenolol (TENORMIN) 100 MG tablet Take 1 tablet (100 mg total) by mouth daily. 08/14/23 08/08/24  Garnette Gunner, MD  Cholecalciferol (VITAMIN D3 PO) Take 5,000 Units by mouth daily.    [provider]  Colchicine 0.6 MG CAPS Day 1: Take 2 caps, then 1 cap an hour later. Day 2 and beyond: 1 cap daily 05/18/23   Garnette Gunner, MD  hydrochlorothiazide (HYDRODIURIL) 25 MG tablet Take 1 tablet (25 mg total) by mouth daily. 08/14/23 11/12/23  Garnette Gunner, MD  Multiple Vitamin (MULTIVITAMIN ADULT PO) Take by mouth. Patient not taking: Reported on 06/20/2023    [provider]  naproxen sodium (ALEVE) 220 MG tablet Take 440 mg by mouth 2 (two) times daily as needed (for pain).    [provider]  rosuvastatin (CRESTOR) 10 MG tablet Take 1 tablet (10 mg total) by mouth daily. 08/14/23 08/08/24  Garnette Gunner, MD  tadalafil (CIALIS) 5 MG tablet Take 1-4 tablets (5-20 mg total) by mouth daily as needed for erectile dysfunction. 11/10/22   Garnette Gunner, MD  testosterone cypionate (DEPOTESTOSTERONE  CYPIONATE) 200 MG/ML injection Inject into the muscle. 0.25 ml twice a week 08/03/23   [provider]  Vitamin D-Vitamin K (VITAMIN K2-VITAMIN D3 PO) Take by mouth.    [provider]    Family History Family History  Problem Relation Age of Onset   Colon cancer Neg Hx    Colon polyps Neg Hx    Esophageal cancer Neg Hx    Stomach cancer Neg Hx    Rectal cancer Neg Hx     Social History Social History   Tobacco Use   Smoking status: Never    Passive exposure: Never   Smokeless tobacco: Former    Types: Engineer, drilling   Vaping status: Never Used  Substance Use Topics   Alcohol use: Yes    Comment: occ   Drug use: No      Allergies   Warfarin sodium, Celebrex [celecoxib], and Hydrocodone   Review of Systems Review of Systems  Skin:        Dog bite     Physical Exam Triage Vital Signs ED Triage Vitals  Encounter Vitals Group     BP 09/23/23 1355 133/77     Systolic BP Percentile --      Diastolic BP Percentile --      Pulse Rate 09/23/23 1355 (!) 59     Resp 09/23/23 1355 16     Temp 09/23/23 1355 98.2 F (36.8 C)     Temp Source 09/23/23 1355 Oral     SpO2 09/23/23 1355 94 %     Weight 09/23/23 1400 274 lb 1.6 oz (124.3 kg)     Height --      Head Circumference --      Peak Flow --      Pain Score 09/23/23 1354 2     Pain Loc --      Pain Education --      Exclude from Growth Chart --    No data found.  Updated Vital Signs BP 133/77 (BP Location: Right Arm)   Pulse (!) 59   Temp 98.2 F (36.8 C) (Oral)   Resp 16   Wt 274 lb 1.6 oz (124.3 kg)   SpO2 94%   BMI 39.90 kg/m   Visual Acuity Right Eye Distance:   Left Eye Distance:   Bilateral Distance:    Right Eye Near:   Left Eye Near:    Bilateral Near:     Physical Exam Vitals reviewed.  Constitutional:      General: He is not in acute distress.    Appearance: Normal appearance. He is not ill-appearing.  HENT:     Head: Normocephalic and atraumatic.  Eyes:     Pupils: Pupils are equal, round, and reactive to light.  Cardiovascular:     Rate and Rhythm: Normal rate.  Pulmonary:     Effort: Pulmonary effort is normal.  Skin:    General: Skin is warm and dry.          Comments: There are 3 puncture wounds to the right calf 2 on the lateral calf and 1 to the posterior calf.  Mild swelling with dried blood noted.  Tender to palpation mildly.  No erythema, streaking, warmth.  Neurological:     Mental Status: He is alert.  Psychiatric:        Mood and Affect: Mood normal.        Behavior: Behavior normal.      UC Treatments / Results  Labs (all labs ordered are listed, but only abnormal results are  displayed) Labs Reviewed - No data to display  EKG   Radiology No results found.  Procedures Procedures (including critical care time)  Medications Ordered in UC Medications  rabies immune globulin (HYPERRAB/KEDRAB) injection 2,475 Units (2,475 Units Infiltration Given 09/23/23 1453)  rabies vaccine (RABAVERT) injection 1 mL (1 mL Intramuscular Given 09/23/23 1450)    Initial Impression / Assessment and Plan / UC Course  I have reviewed the triage vital signs and the nursing notes.  Pertinent labs & imaging results that were available during my care of the patient were reviewed by me and considered in my medical decision making (see chart for details).     Reviewed exam and symptoms with patient.  Wound was cleansed and dressed by nursing staff.  Will start Augmentin.  He is up-to-date on his tetanus.  Rabies vaccination/immunoglobulin initiated today.  Patient instructed to return on day 3, 7, and 14 for additional vaccinations.  Wound care reviewed as well as signs and symptoms of infection reviewed.  Patient to follow-up with PCP 2 to 3 days for recheck.  Strict ER precautions reviewed and patient verbalized understanding. Final Clinical Impressions(s) / UC Diagnoses   Final diagnoses:  Dog bite of right calf, initial encounter     Discharge Instructions      Keep your wound clean and dry.  Change the dressing daily unless it becomes soiled or saturated.  Start Augmentin twice daily for 7 days.  You are given the first rabies vaccination today in clinic.  Please return to the clinic on day 3, 7, and 14 for additional vaccinations.  Please follow-up with your PCP in 2 to 3 days for recheck.  Please go to the ER if you develop any worsening symptoms.  This includes but is not limited to fevers, chills, redness, swelling, warmth, streaking of the wound or any new concerns that arise.  I hope you feel better soon!     ED Prescriptions     Medication Sig Dispense Auth.  Provider   amoxicillin-clavulanate (AUGMENTIN) 875-125 MG tablet Take 1 tablet by mouth every 12 (twelve) hours. 14 tablet Radford Pax, NP      PDMP not reviewed this encounter.   Radford Pax, NP 09/23/23 [phone]

## 2023-09-26 ENCOUNTER — Ambulatory Visit
Admission: EM | Admit: 2023-09-26 | Discharge: 2023-09-26 | Disposition: A | Discharge status: Home / Self Care | Payer: BC Managed Care – PPO | Attending: Internal Medicine | Admitting: Internal Medicine

## 2023-09-26 VITALS — BP 167/92 | HR 53 | Temp 98.4°F | Resp 16 | Wt 273.4 lb

## 2023-09-26 DIAGNOSIS — Z203 Contact with and (suspected) exposure to rabies: Secondary | ICD-10-CM | POA: Diagnosis not present

## 2023-09-26 DIAGNOSIS — Z23 Encounter for immunization: Secondary | ICD-10-CM | POA: Diagnosis not present

## 2023-09-26 MED ORDER — RABIES VACCINE, PCEC IM SUSR
1.0000 mL | Freq: Once | INTRAMUSCULAR | Status: AC
  Administered 2023-09-26: 1 mL via INTRAMUSCULAR

## 2023-09-26 NOTE — ED Triage Notes (Signed)
Patient here today to have day 3 Rabies vaccine. Patient is doing well.

## 2023-09-30 ENCOUNTER — Ambulatory Visit
Admission: EM | Admit: 2023-09-30 | Discharge: 2023-09-30 | Disposition: A | Discharge status: Home / Self Care | Payer: BC Managed Care – PPO | Attending: Urgent Care

## 2023-09-30 DIAGNOSIS — Z23 Encounter for immunization: Secondary | ICD-10-CM | POA: Diagnosis not present

## 2023-09-30 DIAGNOSIS — Z203 Contact with and (suspected) exposure to rabies: Secondary | ICD-10-CM | POA: Diagnosis not present

## 2023-09-30 MED ORDER — RABIES VACCINE, PCEC IM SUSR
1.0000 mL | Freq: Once | INTRAMUSCULAR | Status: AC
  Administered 2023-09-30: 1 mL via INTRAMUSCULAR

## 2023-09-30 NOTE — ED Triage Notes (Signed)
Pt presents fro Rabies vaccines day 7.

## 2023-10-06 ENCOUNTER — Ambulatory Visit
Admission: EM | Admit: 2023-10-06 | Discharge: 2023-10-06 | Disposition: A | Discharge status: Home / Self Care | Payer: BC Managed Care – PPO | Attending: Internal Medicine | Admitting: Internal Medicine

## 2023-10-06 DIAGNOSIS — Z23 Encounter for immunization: Secondary | ICD-10-CM

## 2023-10-06 DIAGNOSIS — Z203 Contact with and (suspected) exposure to rabies: Secondary | ICD-10-CM | POA: Diagnosis not present

## 2023-10-06 MED ORDER — RABIES VACCINE, PCEC IM SUSR
1.0000 mL | Freq: Once | INTRAMUSCULAR | Status: AC
  Administered 2023-10-06: 1 mL via INTRAMUSCULAR

## 2023-10-06 NOTE — ED Triage Notes (Signed)
Pt presents for day 14 of rabies vaccine. Denies concerns at this time.

## 2023-10-20 LAB — BASIC METABOLIC PANEL
BUN: 14 (ref 4–21)
CO2: 26 — AB (ref 13–22)
Chloride: 102 (ref 99–108)
Creatinine: 1 (ref 0.6–1.3)
Glucose: 98
Potassium: 4.1 meq/L (ref 3.5–5.1)
Sodium: 142 (ref 137–147)

## 2023-10-20 LAB — COMPREHENSIVE METABOLIC PANEL
Albumin: 4.4 (ref 3.5–5.0)
Calcium: 9.6 (ref 8.7–10.7)
Globulin: 2.7
eGFR: 89

## 2023-10-20 LAB — VITAMIN B12: Vitamin B-12: 1947

## 2023-10-20 LAB — TESTOSTERONE: Testosterone: 870

## 2023-10-20 LAB — CBC AND DIFFERENTIAL
HCT: 50 (ref 41–53)
Hemoglobin: 16.8 (ref 13.5–17.5)
Platelets: 260 10*3/uL (ref 150–400)
WBC: 7.1

## 2023-10-20 LAB — LIPID PANEL
Cholesterol: 86 (ref 0–200)
HDL: 30 — AB (ref 35–70)
LDL Cholesterol: 41
Triglycerides: 66 (ref 40–160)

## 2023-10-20 LAB — HEPATIC FUNCTION PANEL
ALT: 39 U/L (ref 10–40)
AST: 24 (ref 14–40)
Alkaline Phosphatase: 59 (ref 25–125)
Bilirubin, Total: 0.7

## 2023-10-20 LAB — TSH: TSH: 1.08 (ref 0.41–5.90)

## 2023-10-20 LAB — CBC: RBC: 5.5 — AB (ref 3.87–5.11)

## 2023-10-20 LAB — PSA: PSA: 2.7

## 2023-10-20 LAB — HEMOGLOBIN A1C: Hemoglobin A1C: 5.1

## 2023-10-20 LAB — VITAMIN D 25 HYDROXY (VIT D DEFICIENCY, FRACTURES): Vit D, 25-Hydroxy: 68.5

## 2023-11-08 ENCOUNTER — Encounter: Payer: Self-pay | Admitting: Family Medicine

## 2023-11-13 ENCOUNTER — Other Ambulatory Visit: Payer: Self-pay | Admitting: Family Medicine

## 2023-11-13 DIAGNOSIS — I10 Essential (primary) hypertension: Secondary | ICD-10-CM

## 2024-02-09 ENCOUNTER — Other Ambulatory Visit: Payer: Self-pay | Admitting: Family Medicine

## 2024-02-09 DIAGNOSIS — I10 Essential (primary) hypertension: Secondary | ICD-10-CM

## 2024-05-06 ENCOUNTER — Other Ambulatory Visit: Payer: Self-pay | Admitting: Family Medicine

## 2024-05-06 ENCOUNTER — Telehealth: Payer: Self-pay | Admitting: Family Medicine

## 2024-05-06 DIAGNOSIS — I10 Essential (primary) hypertension: Secondary | ICD-10-CM

## 2024-05-06 NOTE — Telephone Encounter (Signed)
 Copied from CRM [id number]. Topic: Appointments - Scheduling Inquiry for Clinic >> May 06, 2024 11:32 AM Magdalene School wrote: Reason for CRM: Patient called stating that he did lab work last week on 04/30/24 with his welness doctor and would like to know when his next appointment with Dr. Hildy Lowers should be and if the same lab work can be used for his PCP as well.

## 2024-05-06 NOTE — Telephone Encounter (Signed)
 Copied from CRM [id number]. Topic: Clinical - Medication Refill >> May 06, 2024 11:30 AM Vivian Z wrote: Medication: hydrochlorothiazide  (HYDRODIURIL ) 25 MG tablet  Has the patient contacted their pharmacy? Yes (Agent: If no, request that the patient contact the pharmacy for the refill. If patient does not wish to contact the pharmacy document the reason why and proceed with request.) (Agent: If yes, when and what did the pharmacy advise?) No refills avaliable.  This is the patient's preferred pharmacy:  [facility] [address], [place] - [address] [place] [address] Phone: [phone] Fax: [phone]  Is this the correct pharmacy for this prescription? Yes If no, delete pharmacy and type the correct one.   Has the prescription been filled recently? No  Is the patient out of the medication? No  Has the patient been seen for an appointment in the last year OR does the patient have an upcoming appointment? Yes  Can we respond through MyChart? No  Agent: Please be advised that Rx refills may take up to 3 business days. We ask that you follow-up with your pharmacy.

## 2024-05-09 ENCOUNTER — Ambulatory Visit (INDEPENDENT_AMBULATORY_CARE_PROVIDER_SITE_OTHER): Payer: Self-pay | Admitting: Family Medicine

## 2024-05-09 DIAGNOSIS — E66813 Obesity, class 3: Secondary | ICD-10-CM | POA: Diagnosis not present

## 2024-05-09 DIAGNOSIS — M10072 Idiopathic gout, left ankle and foot: Secondary | ICD-10-CM | POA: Diagnosis not present

## 2024-05-09 DIAGNOSIS — I10 Essential (primary) hypertension: Secondary | ICD-10-CM | POA: Diagnosis not present

## 2024-05-09 MED ORDER — ATENOLOL 100 MG PO TABS: 50.0000 mg | ORAL_TABLET | Freq: Every day | ORAL | 3 refills | Status: DC

## 2024-05-09 MED ORDER — HYDROCHLOROTHIAZIDE 25 MG PO TABS: 25.0000 mg | ORAL_TABLET | Freq: Every day | ORAL | 3 refills | Status: AC

## 2024-05-09 MED ORDER — COLCHICINE 0.6 MG PO CAPS: ORAL_CAPSULE | ORAL | 0 refills | Status: AC

## 2024-05-09 NOTE — Progress Notes (Signed)
 Assessment & Plan   Assessment/Plan:    Problem List Items Addressed This Visit       Cardiovascular and Mediastinum   Hypertension   Relevant Medications   hydrochlorothiazide  (HYDRODIURIL ) 25 MG tablet   atenolol  (TENORMIN ) 100 MG tablet     Other   Gout   Relevant Medications   Colchicine  0.6 MG CAPS   Obesity, Class III, BMI 40-49.9 (morbid obesity)   Relevant Medications   Colchicine  0.6 MG CAPS   Other Visit Diagnoses       Other hemochromatosis    -  Primary           Assessment and Plan Assessment & Plan Hypertension Hypertension is well-controlled with current medication regimen. Blood pressure reading today is 132/80 mmHg. He is on atenolol  100 mg and hydrochlorothiazide  25 mg. Discussion about potentially reducing atenolol  dosage if blood pressure remains consistently low, as he is experiencing low heart rate and has been on atenolol  for a long time. Consideration of switching to a beta-blocker with a better metabolic profile, such as carvedilol or nebivolol, due to potential metabolic effects of atenolol . - Reduce atenolol  dosage to 50 mg and monitor blood pressure and heart rate. - Consider switching to carvedilol or nebivolol if metabolic issues persist. - Monitor blood pressure at home and report if consistently below 120/80 mmHg.  Hypercholesterolemia His cholesterol levels are low, which is a concern for testosterone  production. Discussion about the benefits of low cholesterol in reducing heart disease and stroke risk. He is working on weight loss and muscle building, which may naturally improve testosterone  levels. He is no longer on testosterone  injections and is focusing on dietary changes to support cholesterol and testosterone  levels. - Continue dietary modifications to support cholesterol levels, including increased fish intake.  Gout He experienced an acute gout flare in the ankle three weeks ago, which was effectively managed with colchicine   (Mitigare ). He has 20 tablets remaining and is concerned about expiration. Colchicine  is effective for gout and does not affect kidneys, but can cause diarrhea if overused. - Send in a refill for colchicine  to be used as needed for gout flares.  Hemochromatosis He has a form of hemochromatosis that requires regular phlebotomy to manage ferritin levels. He is not on medication for this condition and is managed by giving blood regularly. - Continue regular phlebotomy to manage ferritin levels.  General Health Maintenance He is actively engaged in lifestyle modifications, including alternate day fasting and exercise, to support weight loss and muscle building. He is aware of the importance of a balanced diet and is supplementing with protein shakes and vegetable supplements. Discussion about the benefits of moderate alcohol consumption and the importance of a varied diet. - Continue alternate day fasting and exercise regimen. - Monitor protein intake and consider increasing to support muscle building. - Maintain a balanced diet with adequate vegetable intake. - Moderate alcohol consumption.  Follow-up He plans to return for follow-up around the end of the year. He is also seeing a wellness doctor quarterly for additional lab work and monitoring. - Schedule follow-up appointment around the end of the year. - Bring lab work from wellness doctor to next appointment for review.      Medications Discontinued During This Encounter  Medication Reason   amoxicillin -clavulanate (AUGMENTIN ) 875-125 MG tablet Patient Preference   anastrozole (ARIMIDEX) 1 MG tablet Patient Preference   testosterone  cypionate (DEPOTESTOSTERONE CYPIONATE) 200 MG/ML injection    Colchicine  0.6 MG CAPS Reorder   atenolol  (TENORMIN ) 100  MG tablet Reorder   hydrochlorothiazide  (HYDRODIURIL ) 25 MG tablet Reorder    No follow-ups on file.        Subjective:   Encounter date: 05/09/2024  EHAN FREAS is a 58  y.o. male who has Fracture of ankle, trimalleolar, closed; Dysuria; Gout; Hyperlipidemia; Hypertension; Microscopic hematuria; Obesity; Prostatitis; Numbness; Balance disorder; Lumbosacral radiculopathy; Hypokalemia; Hyponatremia; Obesity, Class III, BMI 40-49.9 (morbid obesity); and Humerus fracture on their problem list..   He  has a past medical history of Hypercholesteremia, Hypertension, and Sepsis secondary to UTI (HCC) (05/19/2022).Devario Bucklew Aas   He presents with chief complaint of Medical Management of Chronic Issues (Medication refill ) .   Discussed the use of AI scribe software for clinical note transcription with the patient, who gave verbal consent to proceed.  History of Present Illness VALIN MASSIE is a 58 year old male with hypertension who presents for medication refill and management of blood pressure medications.  His atenolol  was refilled, but his hydrochlorothiazide  was not, prompting this visit. He usually schedules his appointments in September but decided to come earlier due to the medication issue. He is currently taking atenolol  100 mg and hydrochlorothiazide  25 mg. He has been on atenolol  for a long time.  He used colchicine  for an episode of ankle pain three weeks ago, which resolved after taking three doses. He has about 20 pills left and is concerned about the medication expiring.  He is actively working on weight loss and muscle gain, having lost 22 pounds in the last month and a half through alternate day fasting. He is trying to increase his protein intake to support muscle building, using protein shakes made with bone broth. He is concerned about maintaining muscle mass, particularly in his quads and glutes, which he feels has decreased significantly.  He has a history of hemochromatosis, which requires regular blood donation to manage iron levels. His heart rate is often low, around 52 bpm, which has been noted during blood donation attempts.  He previously used  testosterone  therapy but discontinued it due to discomfort with injections and is now focusing on dietary changes to support natural testosterone  production. He is concerned about his cholesterol levels being too low, as it may affect testosterone  synthesis.     ROS  Past Surgical History:  Procedure Laterality Date   CHOLECYSTECTOMY     left arm surgery Left    ORIF ANKLE FRACTURE  10/15/2011   Procedure: OPEN REDUCTION INTERNAL FIXATION (ORIF) ANKLE FRACTURE;  Surgeon: Jasmine Mesi;  Location: [facility] OR;  Service: Orthopedics;  Laterality: Right;   ROTATOR CUFF REPAIR Left 05/09/2022    Outpatient Medications Prior to Visit  Medication Sig Dispense Refill   Ascorbic Acid (VITAMIN C) 500 MG CAPS Take 1 capsule by mouth daily.     aspirin  EC 81 MG tablet Take 81 mg by mouth daily.       Cholecalciferol (VITAMIN D3 PO) Take 5,000 Units by mouth daily.     Multiple Vitamin (MULTIVITAMIN ADULT PO) Take by mouth.     naproxen sodium (ALEVE) 220 MG tablet Take 440 mg by mouth 2 (two) times daily as needed (for pain).     rosuvastatin  (CRESTOR ) 10 MG tablet Take 1 tablet (10 mg total) by mouth daily. 90 tablet 3   tadalafil  (CIALIS ) 5 MG tablet Take 1-4 tablets (5-20 mg total) by mouth daily as needed for erectile dysfunction. 10 tablet 2   Vitamin D -Vitamin K (VITAMIN K2-VITAMIN D3 PO) Take by mouth.  atenolol  (TENORMIN ) 100 MG tablet Take 1 tablet (100 mg total) by mouth daily. 90 tablet 3   hydrochlorothiazide  (HYDRODIURIL ) 25 MG tablet Take 1 tablet by mouth once daily 90 tablet 0   amoxicillin -clavulanate (AUGMENTIN ) 875-125 MG tablet Take 1 tablet by mouth every 12 (twelve) hours. 14 tablet 0   anastrozole (ARIMIDEX) 1 MG tablet Take 1 mg by mouth once a week.     Colchicine  0.6 MG CAPS Day 1: Take 2 caps, then 1 cap an hour later. Day 2 and beyond: 1 cap daily (Patient not taking: Reported on 05/09/2024) 30 capsule 0   testosterone  cypionate (DEPOTESTOSTERONE CYPIONATE) 200 MG/ML  injection Inject into the muscle. 0.25 ml twice a week (Patient not taking: Reported on 05/09/2024)     No facility-administered medications prior to visit.    Family History  Problem Relation Age of Onset   Colon cancer Neg Hx    Colon polyps Neg Hx    Esophageal cancer Neg Hx    Stomach cancer Neg Hx    Rectal cancer Neg Hx     Social History   Socioeconomic History   Marital status: Married    Spouse name: Isa Manuel   Number of children: 2   Years of education: Not on file   Highest education level: Not on file  Occupational History   Not on file  Tobacco Use   Smoking status: Never    Passive exposure: Never   Smokeless tobacco: Former    Types: Engineer, drilling   Vaping status: Never Used  Substance and Sexual Activity   Alcohol use: Yes    Comment: occ   Drug use: No   Sexual activity: Not on file  Other Topics Concern   Not on file  Social History Narrative   Lives with wife   Caffeine use: 2-3 cups per day   Right handed   Social Drivers of Corporate investment banker Strain: Not on file  Food Insecurity: Not on file  Transportation Needs: Not on file  Physical Activity: Not on file  Stress: Not on file  Social Connections: Not on file  Intimate Partner Violence: Not on file                                                                                                  Objective:  Physical Exam: BP 132/80   Pulse (!) 51   Temp 98.7 F (37.1 C) (Temporal)   Ht 5' 9.5" (1.765 m)   Wt 262 lb (118.8 kg)   SpO2 99%   BMI 38.14 kg/m    Physical Exam VITALS: BP- 132/80 GENERAL: Alert, cooperative, well developed, no acute distress HEENT: Normocephalic, normal oropharynx, moist mucous membranes CHEST: Clear to auscultation bilaterally, No wheezes, rhonchi, or crackles CARDIOVASCULAR: Normal heart rate and rhythm, S1 and S2 normal without murmurs ABDOMEN: Soft, non-tender, non-distended, without organomegaly, Normal bowel sounds EXTREMITIES: No  cyanosis or edema NEUROLOGICAL: Cranial nerves grossly intact, Moves all extremities without gross motor or sensory deficit     No results found.  No results found for this  or any previous visit (from the past 2160 hours).      Carnell Christian, MD, MS

## 2024-05-13 ENCOUNTER — Telehealth: Payer: Self-pay

## 2024-05-13 NOTE — Telephone Encounter (Signed)
 Fax received from [facility] pharmacy asking if Colchicine  0.6 MG CAPS  can be switched to tables, as insurance only covers tablet. Fax response sent "Yes, that is fine. Thank you. Sela Daft. White, RN"

## 2024-05-14 NOTE — Telephone Encounter (Signed)
 Copied from CRM [id number]. Topic: Clinical - Prescription Issue >> May 13, 2024 10:23 AM Lajean Pike wrote: Reason for CRM: [facility] called in and asked if the Colchicine  0.6 MG CAPS can be changed from capsules to tablets because the insurance prefers tablets over capsules.   [facility] [address] [place], [place] - [address] [place] [address] Phone: [phone] Fax: [phone]

## 2024-07-27 ENCOUNTER — Other Ambulatory Visit: Payer: Self-pay | Admitting: Family Medicine

## 2024-07-27 DIAGNOSIS — E7800 Pure hypercholesterolemia, unspecified: Secondary | ICD-10-CM

## 2024-10-22 ENCOUNTER — Other Ambulatory Visit: Payer: Self-pay | Admitting: Family Medicine

## 2024-10-22 DIAGNOSIS — E7800 Pure hypercholesterolemia, unspecified: Secondary | ICD-10-CM

## 2024-10-22 DIAGNOSIS — I10 Essential (primary) hypertension: Secondary | ICD-10-CM

## 2024-12-31 ENCOUNTER — Telehealth: Payer: Self-pay | Admitting: Family Medicine

## 2024-12-31 DIAGNOSIS — I10 Essential (primary) hypertension: Secondary | ICD-10-CM

## 2024-12-31 DIAGNOSIS — E782 Mixed hyperlipidemia: Secondary | ICD-10-CM

## 2024-12-31 DIAGNOSIS — N411 Chronic prostatitis: Secondary | ICD-10-CM

## 2024-12-31 DIAGNOSIS — E66813 Obesity, class 3: Secondary | ICD-10-CM

## 2024-12-31 DIAGNOSIS — E559 Vitamin D deficiency, unspecified: Secondary | ICD-10-CM | POA: Insufficient documentation

## 2024-12-31 DIAGNOSIS — E538 Deficiency of other specified B group vitamins: Secondary | ICD-10-CM | POA: Insufficient documentation

## 2024-12-31 DIAGNOSIS — E291 Testicular hypofunction: Secondary | ICD-10-CM | POA: Insufficient documentation

## 2024-12-31 NOTE — Addendum Note (Signed)
 Addended by: ALESSANDRA DEDRA PARAS on: 12/31/2024 02:10 PM   Modules accepted: Orders

## 2024-12-31 NOTE — Telephone Encounter (Signed)
 Copied from CRM #[id number]. Topic: General - Other >> Dec 30, 2024  2:37 PM Alfonso HERO wrote: Reason for CRM: patient wants to get labs done prior to physical. I informed him that orders are needed in order to schedule. Please contact him to call in if prior labs are ok so we can get him on the schedule.

## 2024-12-31 NOTE — Addendum Note (Signed)
 Addended by: SEBASTIAN CROCK B on: 12/31/2024 03:47 PM   Modules accepted: Orders

## 2024-12-31 NOTE — Telephone Encounter (Signed)
 I called and spoke with patient and scheduled him for a lab visit for 01/31/25 at 820am.

## 2025-01-31 ENCOUNTER — Other Ambulatory Visit

## 2025-02-05 ENCOUNTER — Encounter: Admitting: Family Medicine
